# Patient Record
Sex: Male | Born: 1955
Health system: Southern US, Community
[De-identification: ages and names within clinical notes are randomized; demographics above are authoritative.]

## PROBLEM LIST (undated history)

## (undated) DIAGNOSIS — G4730 Sleep apnea, unspecified: Secondary | ICD-10-CM

## (undated) DIAGNOSIS — J180 Bronchopneumonia, unspecified organism: Secondary | ICD-10-CM

## (undated) DIAGNOSIS — R42 Dizziness and giddiness: Secondary | ICD-10-CM

## (undated) DIAGNOSIS — L309 Dermatitis, unspecified: Secondary | ICD-10-CM

## (undated) DIAGNOSIS — R011 Cardiac murmur, unspecified: Secondary | ICD-10-CM

## (undated) DIAGNOSIS — E119 Type 2 diabetes mellitus without complications: Secondary | ICD-10-CM

## (undated) DIAGNOSIS — M545 Low back pain, unspecified: Secondary | ICD-10-CM

## (undated) DIAGNOSIS — E785 Hyperlipidemia, unspecified: Secondary | ICD-10-CM

## (undated) DIAGNOSIS — F32A Depression, unspecified: Secondary | ICD-10-CM

## (undated) DIAGNOSIS — F419 Anxiety disorder, unspecified: Secondary | ICD-10-CM

## (undated) DIAGNOSIS — G5603 Carpal tunnel syndrome, bilateral upper limbs: Secondary | ICD-10-CM

## (undated) DIAGNOSIS — K219 Gastro-esophageal reflux disease without esophagitis: Secondary | ICD-10-CM

## (undated) DIAGNOSIS — F329 Major depressive disorder, single episode, unspecified: Secondary | ICD-10-CM

## (undated) DIAGNOSIS — I10 Essential (primary) hypertension: Secondary | ICD-10-CM

## (undated) HISTORY — DX: Gastro-esophageal reflux disease without esophagitis: K21.9

## (undated) HISTORY — DX: Low back pain: M54.5

## (undated) HISTORY — PX: POLYPECTOMY: SHX149

## (undated) HISTORY — DX: Dizziness and giddiness: R42

## (undated) HISTORY — DX: Anxiety disorder, unspecified: F41.9

## (undated) HISTORY — DX: Hyperlipidemia, unspecified: E78.5

## (undated) HISTORY — DX: Type 2 diabetes mellitus without complications: E11.9

## (undated) HISTORY — DX: Essential (primary) hypertension: I10

## (undated) HISTORY — DX: Low back pain, unspecified: M54.50

## (undated) HISTORY — DX: Major depressive disorder, single episode, unspecified: F32.9

## (undated) HISTORY — DX: Depression, unspecified: F32.A

## (undated) HISTORY — DX: Cardiac murmur, unspecified: R01.1

## (undated) HISTORY — DX: Sleep apnea, unspecified: G47.30

---

## 2003-11-21 ENCOUNTER — Ambulatory Visit: Payer: Self-pay | Admitting: Licensed Clinical Social Worker

## 2003-11-28 ENCOUNTER — Ambulatory Visit: Payer: Self-pay | Admitting: Licensed Clinical Social Worker

## 2003-12-05 ENCOUNTER — Ambulatory Visit: Payer: Self-pay | Admitting: Licensed Clinical Social Worker

## 2003-12-05 ENCOUNTER — Ambulatory Visit: Payer: Self-pay | Admitting: Internal Medicine

## 2003-12-12 ENCOUNTER — Ambulatory Visit: Payer: Self-pay | Admitting: Licensed Clinical Social Worker

## 2003-12-26 ENCOUNTER — Ambulatory Visit: Payer: Self-pay | Admitting: Internal Medicine

## 2003-12-26 ENCOUNTER — Ambulatory Visit (HOSPITAL_COMMUNITY): Admission: RE | Admit: 2003-12-26 | Discharge: 2003-12-26 | Payer: Self-pay | Admitting: Internal Medicine

## 2003-12-26 ENCOUNTER — Ambulatory Visit: Payer: Self-pay | Admitting: Licensed Clinical Social Worker

## 2004-01-05 ENCOUNTER — Ambulatory Visit: Payer: Self-pay | Admitting: Licensed Clinical Social Worker

## 2004-01-14 ENCOUNTER — Ambulatory Visit: Payer: Self-pay | Admitting: Licensed Clinical Social Worker

## 2004-01-23 ENCOUNTER — Ambulatory Visit: Payer: Self-pay | Admitting: Licensed Clinical Social Worker

## 2004-02-13 ENCOUNTER — Ambulatory Visit: Payer: Self-pay | Admitting: Licensed Clinical Social Worker

## 2004-03-12 ENCOUNTER — Ambulatory Visit: Payer: Self-pay | Admitting: Family Medicine

## 2004-03-12 ENCOUNTER — Ambulatory Visit: Payer: Self-pay | Admitting: Licensed Clinical Social Worker

## 2004-03-22 ENCOUNTER — Ambulatory Visit: Payer: Self-pay | Admitting: Family Medicine

## 2004-05-03 ENCOUNTER — Ambulatory Visit (HOSPITAL_COMMUNITY): Admission: RE | Admit: 2004-05-03 | Discharge: 2004-05-03 | Payer: Self-pay | Admitting: Neurosurgery

## 2004-05-28 ENCOUNTER — Ambulatory Visit: Payer: Self-pay | Admitting: Family Medicine

## 2005-08-08 ENCOUNTER — Ambulatory Visit: Payer: Self-pay | Admitting: Family Medicine

## 2005-08-15 ENCOUNTER — Ambulatory Visit: Payer: Self-pay | Admitting: Family Medicine

## 2005-11-11 ENCOUNTER — Ambulatory Visit: Payer: Self-pay | Admitting: Family Medicine

## 2006-01-02 ENCOUNTER — Encounter: Admission: RE | Admit: 2006-01-02 | Discharge: 2006-01-02 | Payer: Self-pay | Admitting: Family Medicine

## 2006-10-20 ENCOUNTER — Ambulatory Visit: Payer: Self-pay | Admitting: Family Medicine

## 2006-10-20 DIAGNOSIS — K219 Gastro-esophageal reflux disease without esophagitis: Secondary | ICD-10-CM | POA: Insufficient documentation

## 2006-10-20 DIAGNOSIS — R519 Headache, unspecified: Secondary | ICD-10-CM | POA: Insufficient documentation

## 2006-10-20 DIAGNOSIS — R51 Headache: Secondary | ICD-10-CM | POA: Insufficient documentation

## 2006-10-20 DIAGNOSIS — F321 Major depressive disorder, single episode, moderate: Secondary | ICD-10-CM | POA: Insufficient documentation

## 2006-10-23 LAB — CONVERTED CEMR LAB
ALT: 56 units/L — ABNORMAL HIGH (ref 0–53)
AST: 41 units/L — ABNORMAL HIGH (ref 0–37)
Albumin: 4 g/dL (ref 3.5–5.2)
Alkaline Phosphatase: 58 units/L (ref 39–117)
BUN: 10 mg/dL (ref 6–23)
Basophils Absolute: 0 10*3/uL (ref 0.0–0.1)
Basophils Relative: 0.3 % (ref 0.0–1.0)
Bilirubin, Direct: 0.1 mg/dL (ref 0.0–0.3)
CO2: 29 meq/L (ref 19–32)
Calcium: 9.6 mg/dL (ref 8.4–10.5)
Chloride: 106 meq/L (ref 96–112)
Cholesterol: 236 mg/dL (ref 0–200)
Creatinine, Ser: 1.1 mg/dL (ref 0.4–1.5)
Direct LDL: 181.4 mg/dL
Eosinophils Absolute: 0.4 10*3/uL (ref 0.0–0.6)
Eosinophils Relative: 5.1 % — ABNORMAL HIGH (ref 0.0–5.0)
GFR calc Af Amer: 90 mL/min
GFR calc non Af Amer: 75 mL/min
Glucose, Bld: 106 mg/dL — ABNORMAL HIGH (ref 70–99)
HCT: 44.9 % (ref 39.0–52.0)
HDL: 44.8 mg/dL (ref >39.0)
Hemoglobin: 15.4 g/dL (ref 13.0–17.0)
Lymphocytes Relative: 43.7 % (ref 12.0–46.0)
MCHC: 34.2 g/dL (ref 30.0–36.0)
MCV: 91.1 fL (ref 78.0–100.0)
Monocytes Absolute: 0.7 10*3/uL (ref 0.2–0.7)
Monocytes Relative: 10.1 % (ref 3.0–11.0)
Neutro Abs: 2.9 10*3/uL (ref 1.4–7.7)
Neutrophils Relative %: 40.8 % — ABNORMAL LOW (ref 43.0–77.0)
PSA: 0.95 ng/mL (ref 0.10–4.00)
Platelets: 313 10*3/uL (ref 150–400)
Potassium: 4.3 meq/L (ref 3.5–5.1)
RBC: 4.93 M/uL (ref 4.22–5.81)
RDW: 12.7 % (ref 11.5–14.6)
Sodium: 141 meq/L (ref 135–145)
TSH: 1.14 microintl units/mL (ref 0.35–5.50)
Total Bilirubin: 0.8 mg/dL (ref 0.3–1.2)
Total CHOL/HDL Ratio: 5.3
Total Protein: 6.8 g/dL (ref 6.0–8.3)
Triglycerides: 128 mg/dL (ref 0–149)
VLDL: 26 mg/dL (ref 0–40)
WBC: 7.1 10*3/uL (ref 4.5–10.5)

## 2006-10-27 ENCOUNTER — Ambulatory Visit: Payer: Self-pay | Admitting: Family Medicine

## 2006-10-27 DIAGNOSIS — E7800 Pure hypercholesterolemia, unspecified: Secondary | ICD-10-CM | POA: Insufficient documentation

## 2006-10-27 DIAGNOSIS — M545 Low back pain, unspecified: Secondary | ICD-10-CM | POA: Insufficient documentation

## 2006-10-27 DIAGNOSIS — E785 Hyperlipidemia, unspecified: Secondary | ICD-10-CM | POA: Insufficient documentation

## 2007-11-19 ENCOUNTER — Ambulatory Visit: Payer: Self-pay | Admitting: Family Medicine

## 2007-11-19 LAB — CONVERTED CEMR LAB
Bilirubin Urine: NEGATIVE
Glucose, Urine, Semiquant: NEGATIVE
Ketones, urine, test strip: NEGATIVE
Nitrite: NEGATIVE
Protein, U semiquant: NEGATIVE
Specific Gravity, Urine: 1.02
Urobilinogen, UA: 0.2
WBC Urine, dipstick: NEGATIVE
pH: 5.5

## 2007-11-20 LAB — CONVERTED CEMR LAB
ALT: 45 units/L (ref 0–53)
AST: 26 units/L (ref 0–37)
Albumin: 3.8 g/dL (ref 3.5–5.2)
Alkaline Phosphatase: 55 units/L (ref 39–117)
BUN: 17 mg/dL (ref 6–23)
Basophils Absolute: 0 10*3/uL (ref 0.0–0.1)
Basophils Relative: 0 % (ref 0.0–3.0)
Bilirubin, Direct: 0.1 mg/dL (ref 0.0–0.3)
CO2: 29 meq/L (ref 19–32)
Calcium: 9.4 mg/dL (ref 8.4–10.5)
Chloride: 104 meq/L (ref 96–112)
Cholesterol: 212 mg/dL (ref 0–200)
Creatinine, Ser: 1.2 mg/dL (ref 0.4–1.5)
Direct LDL: 129.7 mg/dL
Eosinophils Absolute: 0.3 10*3/uL (ref 0.0–0.7)
Eosinophils Relative: 4.4 % (ref 0.0–5.0)
GFR calc Af Amer: 81 mL/min
GFR calc non Af Amer: 67 mL/min
Glucose, Bld: 93 mg/dL (ref 70–99)
HCT: 43.5 % (ref 39.0–52.0)
HDL: 57.8 mg/dL (ref >39.0)
Hemoglobin: 15.2 g/dL (ref 13.0–17.0)
Lymphocytes Relative: 34.1 % (ref 12.0–46.0)
MCHC: 35 g/dL (ref 30.0–36.0)
MCV: 92.1 fL (ref 78.0–100.0)
Monocytes Absolute: 0.5 10*3/uL (ref 0.1–1.0)
Monocytes Relative: 7.7 % (ref 3.0–12.0)
Neutro Abs: 3.7 10*3/uL (ref 1.4–7.7)
Neutrophils Relative %: 53.8 % (ref 43.0–77.0)
PSA: 0.91 ng/mL (ref 0.10–4.00)
Platelets: 245 10*3/uL (ref 150–400)
Potassium: 4.2 meq/L (ref 3.5–5.1)
RBC: 4.72 M/uL (ref 4.22–5.81)
RDW: 12.6 % (ref 11.5–14.6)
Sodium: 141 meq/L (ref 135–145)
TSH: 1.17 microintl units/mL (ref 0.35–5.50)
Total Bilirubin: 0.7 mg/dL (ref 0.3–1.2)
Total CHOL/HDL Ratio: 3.7
Total Protein: 7 g/dL (ref 6.0–8.3)
Triglycerides: 55 mg/dL (ref 0–149)
VLDL: 11 mg/dL (ref 0–40)
WBC: 6.9 10*3/uL (ref 4.5–10.5)

## 2007-11-26 ENCOUNTER — Ambulatory Visit: Payer: Self-pay | Admitting: Family Medicine

## 2007-11-26 DIAGNOSIS — K5909 Other constipation: Secondary | ICD-10-CM | POA: Insufficient documentation

## 2007-11-26 DIAGNOSIS — R252 Cramp and spasm: Secondary | ICD-10-CM | POA: Insufficient documentation

## 2007-11-26 DIAGNOSIS — L989 Disorder of the skin and subcutaneous tissue, unspecified: Secondary | ICD-10-CM | POA: Insufficient documentation

## 2007-12-24 ENCOUNTER — Encounter: Payer: Self-pay | Admitting: Family Medicine

## 2008-12-15 ENCOUNTER — Ambulatory Visit: Payer: Self-pay | Admitting: Family Medicine

## 2008-12-17 LAB — CONVERTED CEMR LAB
ALT: 62 units/L — ABNORMAL HIGH (ref 0–53)
AST: 37 units/L (ref 0–37)
Albumin: 4 g/dL (ref 3.5–5.2)
Alkaline Phosphatase: 54 units/L (ref 39–117)
BUN: 12 mg/dL (ref 6–23)
Basophils Absolute: 0.1 10*3/uL (ref 0.0–0.1)
Basophils Relative: 0.7 % (ref 0.0–3.0)
Bilirubin Urine: NEGATIVE
Bilirubin, Direct: 0 mg/dL (ref 0.0–0.3)
CO2: 28 meq/L (ref 19–32)
Calcium: 9.1 mg/dL (ref 8.4–10.5)
Chloride: 103 meq/L (ref 96–112)
Cholesterol: 248 mg/dL — ABNORMAL HIGH (ref 0–200)
Creatinine, Ser: 1.1 mg/dL (ref 0.4–1.5)
Direct LDL: 177.8 mg/dL
Eosinophils Absolute: 0.4 10*3/uL (ref 0.0–0.7)
Eosinophils Relative: 4.6 % (ref 0.0–5.0)
GFR calc non Af Amer: 89.41 mL/min (ref >60)
Glucose, Bld: 96 mg/dL (ref 70–99)
Glucose, Urine, Semiquant: NEGATIVE
HCT: 46.1 % (ref 39.0–52.0)
HDL: 64.3 mg/dL (ref >39.00)
Hemoglobin: 15.6 g/dL (ref 13.0–17.0)
Ketones, urine, test strip: NEGATIVE
Lymphocytes Relative: 29.2 % (ref 12.0–46.0)
Lymphs Abs: 2.7 10*3/uL (ref 0.7–4.0)
MCHC: 33.8 g/dL (ref 30.0–36.0)
MCV: 94 fL (ref 78.0–100.0)
Monocytes Absolute: 0.9 10*3/uL (ref 0.1–1.0)
Monocytes Relative: 10.1 % (ref 3.0–12.0)
Neutro Abs: 5.1 10*3/uL (ref 1.4–7.7)
Neutrophils Relative %: 55.4 % (ref 43.0–77.0)
Nitrite: NEGATIVE
PSA: 1.16 ng/mL (ref 0.10–4.00)
Platelets: 230 10*3/uL (ref 150.0–400.0)
Potassium: 4.1 meq/L (ref 3.5–5.1)
Protein, U semiquant: NEGATIVE
RBC: 4.91 M/uL (ref 4.22–5.81)
RDW: 12.8 % (ref 11.5–14.6)
Sodium: 139 meq/L (ref 135–145)
Specific Gravity, Urine: 1.01
TSH: 2.06 microintl units/mL (ref 0.35–5.50)
Total Bilirubin: 0.7 mg/dL (ref 0.3–1.2)
Total CHOL/HDL Ratio: 4
Total Protein: 7.1 g/dL (ref 6.0–8.3)
Triglycerides: 128 mg/dL (ref 0.0–149.0)
Urobilinogen, UA: 0.2
VLDL: 25.6 mg/dL (ref 0.0–40.0)
WBC Urine, dipstick: NEGATIVE
WBC: 9.2 10*3/uL (ref 4.5–10.5)
pH: 6

## 2008-12-29 ENCOUNTER — Ambulatory Visit: Payer: Self-pay | Admitting: Family Medicine

## 2008-12-29 ENCOUNTER — Telehealth: Payer: Self-pay | Admitting: Family Medicine

## 2010-01-15 ENCOUNTER — Ambulatory Visit: Payer: Self-pay | Admitting: Family Medicine

## 2010-01-15 LAB — CONVERTED CEMR LAB
Bilirubin Urine: NEGATIVE
Glucose, Urine, Semiquant: NEGATIVE
Ketones, urine, test strip: NEGATIVE
Nitrite: NEGATIVE
Protein, U semiquant: NEGATIVE
Specific Gravity, Urine: 1.025
Urobilinogen, UA: 0.2
WBC Urine, dipstick: NEGATIVE
pH: 6.5

## 2010-01-22 LAB — CONVERTED CEMR LAB
ALT: 49 units/L (ref 0–53)
AST: 35 units/L (ref 0–37)
Albumin: 3.7 g/dL (ref 3.5–5.2)
Alkaline Phosphatase: 50 units/L (ref 39–117)
BUN: 16 mg/dL (ref 6–23)
Basophils Absolute: 0 10*3/uL (ref 0.0–0.1)
Basophils Relative: 0.5 % (ref 0.0–3.0)
Bilirubin, Direct: 0.1 mg/dL (ref 0.0–0.3)
CO2: 28 meq/L (ref 19–32)
Calcium: 9.1 mg/dL (ref 8.4–10.5)
Chloride: 105 meq/L (ref 96–112)
Cholesterol: 216 mg/dL — ABNORMAL HIGH (ref 0–200)
Creatinine, Ser: 1 mg/dL (ref 0.4–1.5)
Direct LDL: 156.8 mg/dL
Eosinophils Absolute: 0.4 10*3/uL (ref 0.0–0.7)
Eosinophils Relative: 4.7 % (ref 0.0–5.0)
GFR calc non Af Amer: 99.41 mL/min (ref >60.00)
Glucose, Bld: 85 mg/dL (ref 70–99)
HCT: 45.9 % (ref 39.0–52.0)
HDL: 58.1 mg/dL (ref >39.00)
Hemoglobin: 15.4 g/dL (ref 13.0–17.0)
Lymphocytes Relative: 39.1 % (ref 12.0–46.0)
Lymphs Abs: 3 10*3/uL (ref 0.7–4.0)
MCHC: 33.6 g/dL (ref 30.0–36.0)
MCV: 94 fL (ref 78.0–100.0)
Monocytes Absolute: 0.8 10*3/uL (ref 0.1–1.0)
Monocytes Relative: 10.1 % (ref 3.0–12.0)
Neutro Abs: 3.5 10*3/uL (ref 1.4–7.7)
Neutrophils Relative %: 45.6 % (ref 43.0–77.0)
PSA: 1.33 ng/mL (ref 0.10–4.00)
Platelets: 245 10*3/uL (ref 150.0–400.0)
Potassium: 4.2 meq/L (ref 3.5–5.1)
RBC: 4.88 M/uL (ref 4.22–5.81)
RDW: 14.1 % (ref 11.5–14.6)
Sodium: 140 meq/L (ref 135–145)
TSH: 1.45 microintl units/mL (ref 0.35–5.50)
Total Bilirubin: 0.6 mg/dL (ref 0.3–1.2)
Total CHOL/HDL Ratio: 4
Total Protein: 6.5 g/dL (ref 6.0–8.3)
Triglycerides: 126 mg/dL (ref 0.0–149.0)
VLDL: 25.2 mg/dL (ref 0.0–40.0)
WBC: 7.7 10*3/uL (ref 4.5–10.5)

## 2010-01-29 ENCOUNTER — Ambulatory Visit
Admission: RE | Admit: 2010-01-29 | Discharge: 2010-01-29 | Payer: Self-pay | Source: Home / Self Care | Attending: Family Medicine | Admitting: Family Medicine

## 2010-01-29 ENCOUNTER — Encounter: Payer: Self-pay | Admitting: Family Medicine

## 2010-01-29 DIAGNOSIS — R3129 Other microscopic hematuria: Secondary | ICD-10-CM | POA: Insufficient documentation

## 2010-02-18 NOTE — Assessment & Plan Note (Signed)
Summary: cpx/cjr   Vital Signs:  Patient profile:   55 year old male Height:      74.5 inches Weight:      259 pounds BMI:     32.93 O2 Sat:      95 % Temp:     98.2 degrees F Pulse rate:   97 / minute BP sitting:   124 / 86  (left arm) Cuff size:   large  Vitals Entered By: Pura Spice, RN (January 29, 2010 9:02 AM)  Contraindications/Deferment of Procedures/Staging:    Test/Procedure: FLU VAX    Reason for deferment: patient declined  CC: cpx had DOT PE 4 months ago and was told had rt lymph node swollen in rt shoulder area, blood in UA and wants blister left mouth area checked.   History of Present Illness: 55 yr old male for a cpx. he feels good but has some questions. He had a DOT exam about 4 months ago and was told there was some blood in his urine. He has never seen this himself, and he has no urinary symptoms. Also, for 3 months he has had an asymptomatic bump on the inside of his left cheek.   Allergies: 1)  ! Celexa  Past History:  Past Medical History: Reviewed history from 12/29/2008 and no changes required. Depression GERD Headache Hyperlipidemia Low back pain glaucoma, sees Dr. Nelle Don lichen simplex chronicus on the scrotum, per Dr. Terri Piedra  Past Surgical History: Denies surgical history colonoscopy 01-18-03  per Dr. Arlyce Dice, repeat in 10  yrs  Family History: Reviewed history from 10/20/2006 and no changes required. Family History Diabetes 1st degree relative  Social History: Reviewed history from 10/20/2006 and no changes required. Occupation: long Production assistant, radio Married Never Smoked Alcohol use-no Drug use-no Regular exercise-no  Review of Systems  The patient denies anorexia, fever, weight loss, weight gain, vision loss, decreased hearing, hoarseness, chest pain, syncope, dyspnea on exertion, peripheral edema, prolonged cough, headaches, hemoptysis, abdominal pain, melena, hematochezia, severe indigestion/heartburn, hematuria,  incontinence, genital sores, muscle weakness, suspicious skin lesions, transient blindness, difficulty walking, depression, unusual weight change, abnormal bleeding, enlarged lymph nodes, angioedema, breast masses, and testicular masses.    Physical Exam  General:  overweight-appearing.   Head:  Normocephalic and atraumatic without obvious abnormalities. No apparent alopecia or balding. Eyes:  No corneal or conjunctival inflammation noted. EOMI. Perrla. Funduscopic exam benign, without hemorrhages, exudates or papilledema. Vision grossly normal. Ears:  External ear exam shows no significant lesions or deformities.  Otoscopic examination reveals clear canals, tympanic membranes are intact bilaterally without bulging, retraction, inflammation or discharge. Hearing is grossly normal bilaterally. Nose:  External nasal examination shows no deformity or inflammation. Nasal mucosa are pink and moist without lesions or exudates. Mouth:  clear except for a small papular whitish lesion on the left inner cheek  Neck:  clear except for nontender large lipoma over the posterior neck. Full ROM  Chest Wall:  No deformities, masses, tenderness or gynecomastia noted. Lungs:  Normal respiratory effort, chest expands symmetrically. Lungs are clear to auscultation, no crackles or wheezes. Heart:  Normal rate and regular rhythm. S1 and S2 normal without gallop, murmur, click, rub or other extra sounds. EKG normal except for 1st degree AV block  Abdomen:  Bowel sounds positive,abdomen soft and non-tender without masses, organomegaly or hernias noted. Rectal:  No external abnormalities noted. Normal sphincter tone. No rectal masses or tenderness. Heme neg. Genitalia:  Testes bilaterally descended without nodularity, tenderness or masses. No  scrotal masses or lesions. No penis lesions or urethral discharge. Prostate:  Prostate gland firm and smooth, no enlargement, nodularity, tenderness, mass, asymmetry or  induration. Msk:  No deformity or scoliosis noted of thoracic or lumbar spine.   Pulses:  R and L carotid,radial,femoral,dorsalis pedis and posterior tibial pulses are full and equal bilaterally Extremities:  No clubbing, cyanosis, edema, or deformity noted with normal full range of motion of all joints.   Neurologic:  No cranial nerve deficits noted. Station and gait are normal. Plantar reflexes are down-going bilaterally. DTRs are symmetrical throughout. Sensory, motor and coordinative functions appear intact. Skin:  Intact without suspicious lesions or rashes Cervical Nodes:  No lymphadenopathy noted Axillary Nodes:  No palpable lymphadenopathy Inguinal Nodes:  No significant adenopathy Psych:  Cognition and judgment appear intact. Alert and cooperative with normal attention span and concentration. No apparent delusions, illusions, hallucinations   Impression & Recommendations:  Problem # 1:  EXAMINATION, ROUTINE MEDICAL (ICD-V70.0)  Orders: Hemoccult Guaiac-1 spec.(in office) (82270) EKG w/ Interpretation (93000)  Complete Medication List: 1)  Travatan 0.004 % Soln (Travoprost) .... One drop once daily left eye  Other Orders: Urology Referral (Urology)  Patient Instructions: 1)  It is important that you exercise reguarly at least 20 minutes 5 times a week. If you develop chest pain, have severe difficulty breathing, or feel very tired, stop exercising immediately and seek medical attention.  2)  You need to lose weight. Consider a lower calorie diet and regular exercise.  3)  The lesion in his mouth seems to be a benign mucoid cyst. he is due to see his dentist soon, and I told him the dentist could probably removed this in the office. If they were not able to do that, I can refer him to ENT. 4)  Will refer to Urology for the microscopic hematuria    Orders Added: 1)  Est. Patient 40-64 years [99396] 2)  Hemoccult Guaiac-1 spec.(in office) [82270] 3)  EKG w/ Interpretation  [93000] 4)  Urology Referral [Urology]

## 2010-06-04 NOTE — Assessment & Plan Note (Signed)
[facility] OFFICE NOTE   Curtis Duran, Curtis Duran                   MRN:          [id number]  DATE:08/15/2005                            DOB:          11-09-1969    REASON FOR VISIT:  This is a 55 year old gentleman here for complete  physical examination.  He has several things to discuss.  First off, over  the past few months he has had a lot of nocturnal cramps in his feet, and  when he gets out of bed and walks around, they ease off.  They do not bother  him too much during the day.  No other muscles or joints seem to bother him  much.  His lab work shows normal electrolytes, including potassium, as noted  below.  Also, he has been off of Celexa now for about a year.  He felt okay  for awhile, but over the past few months, his depression symptoms have crept  back.  He now feels tired a lot, sad, looses his temper easily, has poor  sleep, etc.   For further details of his past medical history, family history, social  history, habits, etc., I refer you to our introductory note with him dated  March 12, 2004.   ALLERGIES:  NONE.   CURRENT MEDICATIONS:  Aspirin 81 mg daily.   OBJECTIVE:  VITAL SIGNS:  Height 6 feet 4 inches, weight 250.  Blood  pressure 110/70, pulse 70 and regular.  GENERAL:  He appears to be healthy, although he is a little overweight.  Affect is bright.  SKIN:  Free of significant lesions.  HEENT:  Eyes clear.  Ears clear.  Oropharynx clear.  NECK:  Supple without lymphadenopathy or masses.  Carotids show no bruits.  LUNGS:  Clear.  CARDIAC:  Regular rate and rhythm without murmurs, rubs, or gallops.  Distal  pulses are full.  EKG is within normal limits.  ABDOMEN:  Soft, normal bowel sounds, nontender, no masses.  GU:  Normal male genitalia.  He is not circumcised.  RECTAL:  No masses or tenderness.  Prostate is within normal limits.  Stool  is Hemoccult-negative.  EXTREMITIES:   No clubbing, cyanosis, or edema.  NEUROLOGIC:  Grossly intact.   LABORATORY DATA:  He was here for fasting labs on August 08, 2005.  These were  all within normal limits, except for his lipid panel.  Triglycerides were up  at 224.  The LDL was up to 45.  The ______________ was up to 124.  HDL was  slightly low at 36.   ASSESSMENT AND PLAN:  1.  Complete physical exam.  We talked about getting more exercise and      loosing a little weight.  2.  Hyperlipidemia.  We talked about dietary changes he can make.  3.  Depression.  Will get back on Celexa 20 mg once daily.  4.  Foot cramps.  Gave him samples to try Mirapex, to titrate up to 0.25 mg      once at bedtime.  After 10 days, he will call me back to  let me know how      they are working.                                   Tera Mater. Clent Ridges, MD   SAF/MedQ  DD:  08/15/2005  DT:  08/16/2005  Job #:  [id number]

## 2011-03-04 ENCOUNTER — Other Ambulatory Visit: Payer: 59

## 2011-03-07 ENCOUNTER — Other Ambulatory Visit (INDEPENDENT_AMBULATORY_CARE_PROVIDER_SITE_OTHER): Payer: 59

## 2011-03-07 DIAGNOSIS — Z0000 Encounter for general adult medical examination without abnormal findings: Secondary | ICD-10-CM

## 2011-03-07 LAB — CBC WITH DIFFERENTIAL/PLATELET
Basophils Relative: 0.4 % (ref 0.0–3.0)
Eosinophils Absolute: 0.4 10*3/uL (ref 0.0–0.7)
Eosinophils Relative: 4.5 % (ref 0.0–5.0)
Hemoglobin: 16.3 g/dL (ref 13.0–17.0)
Lymphocytes Relative: 37.3 % (ref 12.0–46.0)
Lymphs Abs: 3.4 10*3/uL (ref 0.7–4.0)
MCHC: 33.6 g/dL (ref 30.0–36.0)
MCV: 92.3 fl (ref 78.0–100.0)
Monocytes Absolute: 0.8 10*3/uL (ref 0.1–1.0)
Monocytes Relative: 8.8 % (ref 3.0–12.0)
Neutro Abs: 4.5 10*3/uL (ref 1.4–7.7)
Neutrophils Relative %: 49 % (ref 43.0–77.0)
Platelets: 247 10*3/uL (ref 150.0–400.0)
RBC: 5.24 Mil/uL (ref 4.22–5.81)
RDW: 13.7 % (ref 11.5–14.6)
WBC: 9.1 10*3/uL (ref 4.5–10.5)

## 2011-03-07 LAB — POCT URINALYSIS DIPSTICK
Bilirubin, UA: NEGATIVE
Glucose, UA: NEGATIVE
Leukocytes, UA: NEGATIVE
Nitrite, UA: NEGATIVE
Spec Grav, UA: <=1.005
Urobilinogen, UA: 0.2
pH, UA: 6

## 2011-03-07 LAB — LIPID PANEL
Cholesterol: 249 mg/dL — ABNORMAL HIGH (ref 0–200)
HDL: 65.2 mg/dL (ref >39.00)
Total CHOL/HDL Ratio: 4
Triglycerides: 86 mg/dL (ref 0.0–149.0)

## 2011-03-07 LAB — HEPATIC FUNCTION PANEL
ALT: 58 U/L — ABNORMAL HIGH (ref 0–53)
AST: 30 U/L (ref 0–37)
Albumin: 4.4 g/dL (ref 3.5–5.2)
Alkaline Phosphatase: 54 U/L (ref 39–117)
Bilirubin, Direct: 0.1 mg/dL (ref 0.0–0.3)
Total Bilirubin: 0.4 mg/dL (ref 0.3–1.2)
Total Protein: 7.3 g/dL (ref 6.0–8.3)

## 2011-03-07 LAB — BASIC METABOLIC PANEL
CO2: 27 mEq/L (ref 19–32)
Calcium: 9.5 mg/dL (ref 8.4–10.5)
Creatinine, Ser: 1.1 mg/dL (ref 0.4–1.5)
GFR: 89.64 mL/min (ref >60.00)
Glucose, Bld: 81 mg/dL (ref 70–99)
Sodium: 138 mEq/L (ref 135–145)

## 2011-03-07 LAB — TSH: TSH: 1.7 u[IU]/mL (ref 0.35–5.50)

## 2011-03-07 LAB — PSA: PSA: 2.02 ng/mL (ref 0.10–4.00)

## 2011-03-07 LAB — LDL CHOLESTEROL, DIRECT: Direct LDL: 169.6 mg/dL

## 2011-03-11 ENCOUNTER — Encounter: Payer: Self-pay | Admitting: Family Medicine

## 2011-03-11 NOTE — Progress Notes (Signed)
Quick Note:  I tried to reach pt by phone, no answer. I put a copy of results in mail. I will send in script on 03/14/11, when pt comes in for his CPE. ______

## 2011-03-14 ENCOUNTER — Encounter: Payer: Self-pay | Admitting: Family Medicine

## 2011-03-14 ENCOUNTER — Ambulatory Visit (INDEPENDENT_AMBULATORY_CARE_PROVIDER_SITE_OTHER): Payer: 59 | Admitting: Family Medicine

## 2011-03-14 VITALS — BP 122/70 | HR 117 | Temp 98.9°F | Ht 76.0 in | Wt 259.0 lb

## 2011-03-14 DIAGNOSIS — Z0000 Encounter for general adult medical examination without abnormal findings: Secondary | ICD-10-CM

## 2011-03-14 MED ORDER — ATORVASTATIN CALCIUM 20 MG PO TABS: 20.0000 mg | ORAL_TABLET | Freq: Every day | ORAL | Status: DC

## 2011-03-14 MED ORDER — BUPROPION HCL ER (XL) 150 MG PO TB24: 150.0000 mg | ORAL_TABLET | Freq: Every day | ORAL | Status: DC

## 2011-03-14 NOTE — Progress Notes (Signed)
  Subjective:    Patient ID: Curtis Duran, male    DOB: 01-25-1955, 56 y.o.   MRN: [id number]  HPI 56 yr old male for a cpx. He feels well in general except he has had some depression flare up lately. He feels sad at times and tends to isolate himself from friends. He gets a short temper at times. We tried Citalopram a few years ago but he had to stop it due to a rash. He also saw Judithe Modest for awhile for therapy, but he found this to not be helpful. He sleeps well.    Review of Systems  Constitutional: Negative.   HENT: Negative.   Eyes: Negative.   Respiratory: Negative.   Cardiovascular: Negative.   Gastrointestinal: Negative.   Genitourinary: Negative.   Musculoskeletal: Negative.   Skin: Negative.   Neurological: Negative.   Hematological: Negative.   Psychiatric/Behavioral: Positive for dysphoric mood. Negative for suicidal ideas, hallucinations, behavioral problems, confusion, sleep disturbance, decreased concentration and agitation. The patient is nervous/anxious.        Objective:   Physical Exam  Constitutional: He is oriented to person, place, and time. He appears well-developed and well-nourished. No distress.  HENT:  Head: Normocephalic and atraumatic.  Right Ear: External ear normal.  Left Ear: External ear normal.  Nose: Nose normal.  Mouth/Throat: Oropharynx is clear and moist. No oropharyngeal exudate.  Eyes: Conjunctivae and EOM are normal. Pupils are equal, round, and reactive to light. Right eye exhibits no discharge. Left eye exhibits no discharge. No scleral icterus.  Neck: Neck supple. No JVD present. No tracheal deviation present. No thyromegaly present.  Cardiovascular: Normal rate, regular rhythm, normal heart sounds and intact distal pulses.  Exam reveals no gallop and no friction rub.   No murmur heard.      EKG normal   Pulmonary/Chest: Effort normal and breath sounds normal. No respiratory distress. He has no wheezes. He has no rales. He  exhibits no tenderness.  Abdominal: Soft. Bowel sounds are normal. He exhibits no distension and no mass. There is no tenderness. There is no rebound and no guarding.  Genitourinary: Rectum normal, prostate normal and penis normal. Guaiac negative stool. No penile tenderness.  Musculoskeletal: Normal range of motion. He exhibits no edema and no tenderness.  Lymphadenopathy:    He has no cervical adenopathy.  Neurological: He is alert and oriented to person, place, and time. He has normal reflexes. No cranial nerve deficit. He exhibits normal muscle tone. Coordination normal.  Skin: Skin is warm and dry. No rash noted. He is not diaphoretic. No erythema. No pallor.  Psychiatric: He has a normal mood and affect. His behavior is normal. Judgment and thought content normal.          Assessment & Plan:  Well exam. Try Wellbutrin for depression, and recheck in 3-4 weeks

## 2011-06-03 ENCOUNTER — Telehealth: Payer: Self-pay | Admitting: Family Medicine

## 2011-06-03 MED ORDER — ATORVASTATIN CALCIUM 20 MG PO TABS: 20.0000 mg | ORAL_TABLET | Freq: Every day | ORAL | Status: DC

## 2011-06-03 NOTE — Telephone Encounter (Signed)
Refill request for Bupropion HCL XL 150 mg take 1 po qd and pt last here on 03/14/11.

## 2011-06-03 NOTE — Telephone Encounter (Signed)
Refill for one year 

## 2011-06-06 MED ORDER — BUPROPION HCL ER (XL) 150 MG PO TB24: 150.0000 mg | ORAL_TABLET | Freq: Every day | ORAL | Status: DC

## 2011-06-06 NOTE — Telephone Encounter (Signed)
I'm waiting on pt to return my call, need to know where to send in script and if it is a 90 day supply?

## 2011-06-07 NOTE — Telephone Encounter (Signed)
I tried to reach pt again, no answer.  

## 2011-06-10 ENCOUNTER — Other Ambulatory Visit: Payer: Self-pay | Admitting: Family Medicine

## 2011-06-10 NOTE — Telephone Encounter (Signed)
Pt is now requesting to get Butropion HCL XL 150 mg through mail order. ( [facility] ) Can I send this in?

## 2011-06-14 ENCOUNTER — Telehealth: Payer: Self-pay | Admitting: Family Medicine

## 2011-06-14 DIAGNOSIS — E785 Hyperlipidemia, unspecified: Secondary | ICD-10-CM

## 2011-06-14 MED ORDER — BUPROPION HCL ER (XL) 150 MG PO TB24: 150.0000 mg | ORAL_TABLET | Freq: Every day | ORAL | Status: DC

## 2011-06-14 NOTE — Telephone Encounter (Signed)
Lipids and liver panel for 272.4 , thanks

## 2011-06-14 NOTE — Telephone Encounter (Signed)
Pt is on chole med and was told to repeat labs in 90days. What labs?

## 2011-06-14 NOTE — Telephone Encounter (Signed)
Can you call to schedule the lab appointment? I put in future lab order.

## 2011-06-15 ENCOUNTER — Other Ambulatory Visit: Payer: Self-pay | Admitting: Family Medicine

## 2011-06-15 NOTE — Telephone Encounter (Signed)
Called pt and sch for labs as noted.  °

## 2011-06-23 ENCOUNTER — Telehealth: Payer: Self-pay | Admitting: Family Medicine

## 2011-06-23 MED ORDER — BUPROPION HCL ER (XL) 150 MG PO TB24: 150.0000 mg | ORAL_TABLET | Freq: Every day | ORAL | Status: DC

## 2011-06-23 NOTE — Telephone Encounter (Signed)
Pt requested a 90 day supply of Wellbutrin and send to [facility]. I did print and faxed.

## 2011-06-27 ENCOUNTER — Other Ambulatory Visit (INDEPENDENT_AMBULATORY_CARE_PROVIDER_SITE_OTHER): Payer: 59

## 2011-06-27 DIAGNOSIS — E785 Hyperlipidemia, unspecified: Secondary | ICD-10-CM

## 2011-06-27 LAB — LIPID PANEL
Cholesterol: 145 mg/dL (ref 0–200)
LDL Cholesterol: 65 mg/dL (ref 0–99)
Total CHOL/HDL Ratio: 2
Triglycerides: 72 mg/dL (ref 0.0–149.0)

## 2011-06-27 LAB — HEPATIC FUNCTION PANEL
ALT: 52 U/L (ref 0–53)
AST: 27 U/L (ref 0–37)
Alkaline Phosphatase: 47 U/L (ref 39–117)
Bilirubin, Direct: 0.1 mg/dL (ref 0.0–0.3)
Total Bilirubin: 0.3 mg/dL (ref 0.3–1.2)
Total Protein: 6.9 g/dL (ref 6.0–8.3)

## 2012-01-18 HISTORY — PX: CERVICAL SPINE SURGERY: SHX589

## 2012-02-10 ENCOUNTER — Ambulatory Visit (INDEPENDENT_AMBULATORY_CARE_PROVIDER_SITE_OTHER): Payer: 59 | Admitting: Family Medicine

## 2012-02-10 ENCOUNTER — Encounter: Payer: Self-pay | Admitting: Family Medicine

## 2012-02-10 VITALS — BP 130/76 | HR 107 | Temp 98.4°F | Wt 265.0 lb

## 2012-02-10 DIAGNOSIS — M542 Cervicalgia: Secondary | ICD-10-CM

## 2012-02-10 MED ORDER — DICLOFENAC SODIUM 75 MG PO TBEC: 75.0000 mg | DELAYED_RELEASE_TABLET | Freq: Two times a day (BID) | ORAL | Status: DC

## 2012-02-10 NOTE — Progress Notes (Signed)
  Subjective:    Patient ID: Curtis Duran, male    DOB: February 25, 1955, 57 y.o.   MRN: [id number]  HPI Here for 3 weeks of sharp sometimes severe pains in the left neck, the left shoulder, and the left arm. These are worse when he rests the arm down at his side or when he lies flat in bed. It feels better when he raises the arm over his head. The left lower arm and hand often go numb. The arm is weak and he has trouble gripping things with the left hand. No recent trauma. Aleve and heat help slightly.    Review of Systems  Musculoskeletal: Positive for myalgias and arthralgias.  Neurological: Positive for weakness and numbness.       Objective:   Physical Exam  Constitutional: He appears well-developed and well-nourished.  Musculoskeletal:       He is quite tender over the cervical spine and the left posterior neck. ROM is reduced a bit. He has a larger lipoma over the posterior neck. Tender over the left trapezius and the left anterior shoulder. ROM of the shoulder is full.           Assessment & Plan:  He seems to have a pinched nerve in the neck which is sending symptoms radiating down the left arm. Try Diclofenac. Set up an MRI soon. Out of work today until 02-19-12.

## 2012-02-15 ENCOUNTER — Telehealth: Payer: Self-pay | Admitting: Family Medicine

## 2012-02-15 NOTE — Telephone Encounter (Signed)
Pt needed work note extended, per Dr. Clent Ridges okay to give. ( 02/19/12-02/21/12 ) returning on 02/22/12. Note is ready for pick up and I spoke with pt.

## 2012-02-17 ENCOUNTER — Ambulatory Visit
Admission: RE | Admit: 2012-02-17 | Discharge: 2012-02-17 | Disposition: A | Discharge status: Home / Self Care | Payer: 59 | Source: Ambulatory Visit | Attending: Family Medicine | Admitting: Family Medicine

## 2012-02-17 DIAGNOSIS — M542 Cervicalgia: Secondary | ICD-10-CM

## 2012-02-21 ENCOUNTER — Telehealth: Payer: Self-pay | Admitting: Family Medicine

## 2012-02-21 NOTE — Telephone Encounter (Signed)
I did speak with pt and went over results. He wants to know when he can return to work? He is still having pain and not sure that he can work?

## 2012-02-21 NOTE — Progress Notes (Signed)
Quick Note:  I spoke with pt ______ 

## 2012-02-21 NOTE — Addendum Note (Signed)
Addended by: Gershon Crane A on: 02/21/2012 06:07 AM   Modules accepted: Orders

## 2012-02-21 NOTE — Telephone Encounter (Signed)
Pt would like MRI results from friday

## 2012-02-22 NOTE — Telephone Encounter (Signed)
I left voice message with below information. 

## 2012-02-22 NOTE — Telephone Encounter (Signed)
I would stay out of work until he is evaluated by Neurosurgery

## 2012-03-06 ENCOUNTER — Other Ambulatory Visit (HOSPITAL_COMMUNITY): Payer: Self-pay | Admitting: Neurosurgery

## 2012-03-06 ENCOUNTER — Other Ambulatory Visit: Payer: Self-pay | Admitting: Neurosurgery

## 2012-03-06 DIAGNOSIS — M47812 Spondylosis without myelopathy or radiculopathy, cervical region: Secondary | ICD-10-CM

## 2012-03-09 ENCOUNTER — Telehealth: Payer: Self-pay | Admitting: Family Medicine

## 2012-03-09 NOTE — Telephone Encounter (Signed)
I already filled these papers out days ago and sent to have them faxed out, check with Medical Records

## 2012-03-09 NOTE — Telephone Encounter (Signed)
FYI: Pt states he is trying to get short term disibiilty. Company has sent several requests, but they have not heard from [place]. [employer] is faxing papers today for Dr Clent Ridges to fill out, and they must be filled out today in order for him to get this short term disibility. Pt's grace period has expired, but if they get today, they will file for him. If not, they will close file, and he will never get.

## 2012-03-09 NOTE — Telephone Encounter (Signed)
I spoke with pt and faxed over a copy of the office notes to [employer] [phone] and the rest of the of information should come from Dr. Barbaraann Barthel ( neurosurgery )

## 2012-03-16 ENCOUNTER — Other Ambulatory Visit: Payer: 59

## 2012-03-22 ENCOUNTER — Encounter (HOSPITAL_COMMUNITY): Payer: Self-pay

## 2012-03-22 ENCOUNTER — Ambulatory Visit (HOSPITAL_COMMUNITY)
Admission: RE | Admit: 2012-03-22 | Discharge: 2012-03-22 | Disposition: A | Discharge status: Home / Self Care | Payer: 59 | Source: Ambulatory Visit | Attending: Neurosurgery | Admitting: Neurosurgery

## 2012-03-22 DIAGNOSIS — M25519 Pain in unspecified shoulder: Secondary | ICD-10-CM | POA: Insufficient documentation

## 2012-03-22 DIAGNOSIS — R209 Unspecified disturbances of skin sensation: Secondary | ICD-10-CM | POA: Insufficient documentation

## 2012-03-22 DIAGNOSIS — M47812 Spondylosis without myelopathy or radiculopathy, cervical region: Secondary | ICD-10-CM

## 2012-03-22 DIAGNOSIS — M4802 Spinal stenosis, cervical region: Secondary | ICD-10-CM | POA: Insufficient documentation

## 2012-03-22 DIAGNOSIS — M79609 Pain in unspecified limb: Secondary | ICD-10-CM | POA: Insufficient documentation

## 2012-03-22 DIAGNOSIS — M5020 Other cervical disc displacement, unspecified cervical region: Secondary | ICD-10-CM | POA: Insufficient documentation

## 2012-03-22 MED ORDER — IOHEXOL 300 MG/ML  SOLN
10.0000 mL | Freq: Once | INTRAMUSCULAR | Status: AC | PRN
  Administered 2012-03-22: 10 mL via INTRATHECAL

## 2012-03-22 MED ORDER — OXYCODONE HCL 5 MG PO TABS
5.0000 mg | ORAL_TABLET | ORAL | Status: DC | PRN
  Administered 2012-03-22: 5 mg via ORAL

## 2012-03-22 MED ORDER — DIAZEPAM 5 MG PO TABS
ORAL_TABLET | ORAL | Status: AC
  Administered 2012-03-22: 10 mg via ORAL
  Filled 2012-03-22: qty 2

## 2012-03-22 MED ORDER — OXYCODONE HCL 5 MG PO TABS
ORAL_TABLET | ORAL | Status: AC
  Filled 2012-03-22: qty 1

## 2012-03-22 MED ORDER — DIAZEPAM 5 MG PO TABS
10.0000 mg | ORAL_TABLET | Freq: Once | ORAL | Status: AC
  Administered 2012-03-22: 10 mg via ORAL

## 2012-03-22 MED ORDER — ONDANSETRON HCL 4 MG/2ML IJ SOLN: 4.0000 mg | Freq: Four times a day (QID) | INTRAMUSCULAR | Status: DC | PRN

## 2012-03-23 ENCOUNTER — Encounter: Payer: 59 | Admitting: Family Medicine

## 2012-05-03 ENCOUNTER — Other Ambulatory Visit: Payer: Self-pay | Admitting: Family Medicine

## 2012-06-19 ENCOUNTER — Other Ambulatory Visit: Payer: Self-pay | Admitting: Family Medicine

## 2012-08-06 ENCOUNTER — Encounter: Payer: Self-pay | Admitting: Gastroenterology

## 2012-10-01 ENCOUNTER — Encounter: Payer: Self-pay | Admitting: Gastroenterology

## 2012-10-22 ENCOUNTER — Ambulatory Visit (AMBULATORY_SURGERY_CENTER): Payer: Self-pay | Admitting: *Deleted

## 2012-10-22 VITALS — Ht 76.0 in | Wt 263.2 lb

## 2012-10-22 DIAGNOSIS — Z1211 Encounter for screening for malignant neoplasm of colon: Secondary | ICD-10-CM

## 2012-10-22 MED ORDER — NA SULFATE-K SULFATE-MG SULF 17.5-3.13-1.6 GM/177ML PO SOLN: 1.0000 | Freq: Once | ORAL | Status: DC

## 2012-10-22 NOTE — Progress Notes (Signed)
No allergies to eggs or soy. No problems with sedation.

## 2012-10-23 ENCOUNTER — Encounter: Payer: Self-pay | Admitting: Gastroenterology

## 2012-11-16 ENCOUNTER — Encounter: Payer: 59 | Admitting: Gastroenterology

## 2012-12-19 ENCOUNTER — Ambulatory Visit (AMBULATORY_SURGERY_CENTER): Payer: 59 | Admitting: Gastroenterology

## 2012-12-19 ENCOUNTER — Encounter: Payer: Self-pay | Admitting: Gastroenterology

## 2012-12-19 VITALS — BP 110/73 | HR 90 | Temp 97.1°F | Resp 21 | Ht 76.0 in | Wt 263.0 lb

## 2012-12-19 DIAGNOSIS — Z1211 Encounter for screening for malignant neoplasm of colon: Secondary | ICD-10-CM

## 2012-12-19 DIAGNOSIS — D126 Benign neoplasm of colon, unspecified: Secondary | ICD-10-CM

## 2012-12-19 DIAGNOSIS — K5730 Diverticulosis of large intestine without perforation or abscess without bleeding: Secondary | ICD-10-CM

## 2012-12-19 HISTORY — PX: COLONOSCOPY: SHX174

## 2012-12-19 MED ORDER — SODIUM CHLORIDE 0.9 % IV SOLN: 500.0000 mL | INTRAVENOUS | Status: DC

## 2012-12-19 NOTE — Op Note (Signed)
[facility] [address] [place], [address]   COLONOSCOPY PROCEDURE REPORT  PATIENT: Curtis Duran, Curtis Duran  MR#: [id number] BIRTHDATE: 01-Nov-1955 , 57  yrs. old GENDER: Male ENDOSCOPIST: Louis Meckel, MD REFERRED QI:ONGEX Pratt, M.D. PROCEDURE DATE:  12/19/2012 PROCEDURE:   Colonoscopy with snare polypectomy First Screening Colonoscopy - Avg.  risk and is 50 yrs.  old or older - No.  Prior Negative Screening - Now for repeat screening. 10 or more years since last screening  History of Adenoma - Now for follow-up colonoscopy & has been > or = to 3 yrs.  N/A  Polyps Removed Today? Yes. ASA CLASS:   Class II INDICATIONS:Average risk patient for colon cancer. MEDICATIONS: MAC sedation, administered by CRNA and propofol (Diprivan) 300mg  IV  DESCRIPTION OF PROCEDURE:   After the risks benefits and alternatives of the procedure were thoroughly explained, informed consent was obtained.  A digital rectal exam revealed no abnormalities of the rectum.   The LB [id number]  endoscope was introduced through the anus and advanced to the cecum, which was identified by both the appendix and ileocecal valve.  I was unable to retroflex in the cecum to visualize the descending colon. No adverse events experienced.   The quality of the prep was Suprep good  The instrument was then slowly withdrawn as the colon was fully examined.      COLON FINDINGS: A sessile polyp measuring 4 mm in size was found in the ascending colon.  A polypectomy was performed with a cold snare.  The resection was complete and the polyp tissue was completely retrieved.   Mild diverticulosis was noted in the ascending colon.   Moderate diverticulosis was noted in the sigmoid colon and descending colon.   The colon mucosa was otherwise normal.  Retroflexed views revealed no abnormalities. The time to cecum=4 minutes 38 seconds.  Withdrawal time=12 minutes 36 seconds. The scope was withdrawn and  the procedure completed. COMPLICATIONS: There were no complications.  ENDOSCOPIC IMPRESSION: 1.   Sessile polyp measuring 4 mm in size was found in the ascending colon; polypectomy was performed with a cold snare 2.   Mild diverticulosis was noted in the ascending colon 3.   Moderate diverticulosis was noted in the sigmoid colon and descending colon 4.   The colon mucosa was otherwise normal  RECOMMENDATIONS: If the polyp(s) removed today are proven to be adenomatous (pre-cancerous) polyps, you will need a repeat colonoscopy in 5 years.  Otherwise you should continue to follow colorectal cancer screening guidelines for "routine risk" patients with colonoscopy in 10 years.  You will receive a letter within 1-2 weeks with the results of your biopsy as well as final recommendations.  Please call my office if you have not received a letter after 3 weeks.   eSigned:  Louis Meckel, MD 12/19/2012 2:54 PM   cc:   PATIENT NAME:  Advit, Trethewey MR#: [id number]

## 2012-12-19 NOTE — Progress Notes (Signed)
Procedure ends, to recovery, eport given and VSS. 

## 2012-12-19 NOTE — Progress Notes (Signed)
Called to room to assist during endoscopic procedure.  Patient ID and intended procedure confirmed with present staff. Received instructions for my participation in the procedure from the performing physician.  

## 2012-12-19 NOTE — Progress Notes (Signed)
Patient did not experience any of the following events: a burn prior to discharge; a fall within the facility; wrong site/side/patient/procedure/implant event; or a hospital transfer or hospital admission upon discharge from the facility. (G8907) Patient did not have preoperative order for IV antibiotic SSI prophylaxis. (G8918)  

## 2012-12-19 NOTE — Patient Instructions (Signed)
Discharge instructions given with verbal understanding. Handouts on polyps and diverticulosis. Resume previous medications. YOU HAD AN ENDOSCOPIC PROCEDURE TODAY AT THE [facility]: Refer to the procedure report that was given to you for any specific questions about what was found during the examination.  If the procedure report does not answer your questions, please call your gastroenterologist to clarify.  If you requested that your care partner not be given the details of your procedure findings, then the procedure report has been included in a sealed envelope for you to review at your convenience later.  YOU SHOULD EXPECT: Some feelings of bloating in the abdomen. Passage of more gas than usual.  Walking can help get rid of the air that was put into your GI tract during the procedure and reduce the bloating. If you had a lower endoscopy (such as a colonoscopy or flexible sigmoidoscopy) you may notice spotting of blood in your stool or on the toilet paper. If you underwent a bowel prep for your procedure, then you may not have a normal bowel movement for a few days.  DIET: Your first meal following the procedure should be a light meal and then it is ok to progress to your normal diet.  A half-sandwich or bowl of soup is an example of a good first meal.  Heavy or fried foods are harder to digest and may make you feel nauseous or bloated.  Likewise meals heavy in dairy and vegetables can cause extra gas to form and this can also increase the bloating.  Drink plenty of fluids but you should avoid alcoholic beverages for 24 hours.  ACTIVITY: Your care partner should take you home directly after the procedure.  You should plan to take it easy, moving slowly for the rest of the day.  You can resume normal activity the day after the procedure however you should NOT DRIVE or use heavy machinery for 24 hours (because of the sedation medicines used during the test).    SYMPTOMS TO REPORT  IMMEDIATELY: A gastroenterologist can be reached at any hour.  During normal business hours, 8:30 AM to 5:00 PM Monday through Friday, call [phone].  After hours and on weekends, please call the GI answering service at [phone] who will take a message and have the physician on call contact you.   Following lower endoscopy (colonoscopy or flexible sigmoidoscopy):  Excessive amounts of blood in the stool  Significant tenderness or worsening of abdominal pains  Swelling of the abdomen that is new, acute  Fever of 100F or higher  FOLLOW UP: If any biopsies were taken you will be contacted by phone or by letter within the next 1-3 weeks.  Call your gastroenterologist if you have not heard about the biopsies in 3 weeks.  Our staff will call the home number listed on your records the next business day following your procedure to check on you and address any questions or concerns that you may have at that time regarding the information given to you following your procedure. This is a courtesy call and so if there is no answer at the home number and we have not heard from you through the emergency physician on call, we will assume that you have returned to your regular daily activities without incident.  SIGNATURES/CONFIDENTIALITY: You and/or your care partner have signed paperwork which will be entered into your electronic medical record.  These signatures attest to the fact that that the information above on your After Visit Summary   has been reviewed and is understood.  Full responsibility of the confidentiality of this discharge information lies with you and/or your care-partner. 

## 2012-12-20 ENCOUNTER — Telehealth: Payer: Self-pay

## 2012-12-20 NOTE — Telephone Encounter (Signed)
  Follow up Call-  Call back number 12/19/2012  Post procedure Call Back phone  # [phone]  Permission to leave phone message Yes     Patient questions:  Do you have a fever, pain , or abdominal swelling? no Pain Score  0 *  Have you tolerated food without any problems? yes  Have you been able to return to your normal activities? yes  Do you have any questions about your discharge instructions: Diet   no Medications  no Follow up visit  no  Do you have questions or concerns about your Care? no  Actions: * If pain score is 4 or above: No action needed, pain <4.  No problems per the pt. Maw

## 2012-12-25 ENCOUNTER — Encounter: Payer: Self-pay | Admitting: Gastroenterology

## 2013-01-05 ENCOUNTER — Other Ambulatory Visit: Payer: Self-pay | Admitting: Family Medicine

## 2013-01-25 ENCOUNTER — Other Ambulatory Visit (INDEPENDENT_AMBULATORY_CARE_PROVIDER_SITE_OTHER): Payer: 59

## 2013-01-25 DIAGNOSIS — Z0000 Encounter for general adult medical examination without abnormal findings: Secondary | ICD-10-CM

## 2013-01-25 LAB — POCT URINALYSIS DIPSTICK
BILIRUBIN UA: NEGATIVE
Glucose, UA: NEGATIVE
Ketones, UA: NEGATIVE
LEUKOCYTES UA: NEGATIVE
Nitrite, UA: NEGATIVE
PH UA: 6.5
Protein, UA: NEGATIVE
Spec Grav, UA: <=1.005
Urobilinogen, UA: 0.2

## 2013-01-25 LAB — LIPID PANEL
CHOLESTEROL: 164 mg/dL (ref 0–200)
HDL: 55.4 mg/dL (ref >39.00)
LDL Cholesterol: 92 mg/dL (ref 0–99)
Total CHOL/HDL Ratio: 3
Triglycerides: 85 mg/dL (ref 0.0–149.0)
VLDL: 17 mg/dL (ref 0.0–40.0)

## 2013-01-25 LAB — CBC WITH DIFFERENTIAL/PLATELET
BASOS ABS: 0 10*3/uL (ref 0.0–0.1)
Basophils Relative: 0.5 % (ref 0.0–3.0)
EOS ABS: 0.3 10*3/uL (ref 0.0–0.7)
Eosinophils Relative: 4.4 % (ref 0.0–5.0)
HCT: 45.3 % (ref 39.0–52.0)
Hemoglobin: 15.4 g/dL (ref 13.0–17.0)
LYMPHS PCT: 30.7 % (ref 12.0–46.0)
Lymphs Abs: 2.4 10*3/uL (ref 0.7–4.0)
MCHC: 34 g/dL (ref 30.0–36.0)
MCV: 91.1 fl (ref 78.0–100.0)
MONO ABS: 0.7 10*3/uL (ref 0.1–1.0)
Monocytes Relative: 9.3 % (ref 3.0–12.0)
NEUTROS PCT: 55.1 % (ref 43.0–77.0)
Neutro Abs: 4.2 10*3/uL (ref 1.4–7.7)
PLATELETS: 280 10*3/uL (ref 150.0–400.0)
RBC: 4.98 Mil/uL (ref 4.22–5.81)
RDW: 13.9 % (ref 11.5–14.6)
WBC: 7.7 10*3/uL (ref 4.5–10.5)

## 2013-01-25 LAB — HEPATIC FUNCTION PANEL
ALK PHOS: 46 U/L (ref 39–117)
ALT: 46 U/L (ref 0–53)
AST: 30 U/L (ref 0–37)
Albumin: 4.1 g/dL (ref 3.5–5.2)
BILIRUBIN DIRECT: 0.2 mg/dL (ref 0.0–0.3)
Total Bilirubin: 0.7 mg/dL (ref 0.3–1.2)
Total Protein: 6.9 g/dL (ref 6.0–8.3)

## 2013-01-25 LAB — BASIC METABOLIC PANEL
BUN: 11 mg/dL (ref 6–23)
CALCIUM: 9.7 mg/dL (ref 8.4–10.5)
CHLORIDE: 105 meq/L (ref 96–112)
CO2: 27 meq/L (ref 19–32)
CREATININE: 1.3 mg/dL (ref 0.4–1.5)
GFR: 75.33 mL/min (ref >60.00)
Glucose, Bld: 99 mg/dL (ref 70–99)
Potassium: 4.8 mEq/L (ref 3.5–5.1)
Sodium: 140 mEq/L (ref 135–145)

## 2013-01-25 LAB — TSH: TSH: 1.3 u[IU]/mL (ref 0.35–5.50)

## 2013-01-25 LAB — PSA: PSA: 2.34 ng/mL (ref 0.10–4.00)

## 2013-02-01 ENCOUNTER — Ambulatory Visit (INDEPENDENT_AMBULATORY_CARE_PROVIDER_SITE_OTHER): Payer: 59 | Admitting: Family Medicine

## 2013-02-01 ENCOUNTER — Encounter: Payer: Self-pay | Admitting: Family Medicine

## 2013-02-01 VITALS — BP 150/90 | HR 133 | Temp 100.6°F | Ht 76.0 in | Wt 265.0 lb

## 2013-02-01 DIAGNOSIS — B9789 Other viral agents as the cause of diseases classified elsewhere: Secondary | ICD-10-CM

## 2013-02-01 DIAGNOSIS — Z0000 Encounter for general adult medical examination without abnormal findings: Secondary | ICD-10-CM

## 2013-02-01 DIAGNOSIS — B349 Viral infection, unspecified: Secondary | ICD-10-CM

## 2013-02-01 MED ORDER — CLOTRIMAZOLE-BETAMETHASONE 1-0.05 % EX CREA: 1.0000 "application " | TOPICAL_CREAM | Freq: Two times a day (BID) | CUTANEOUS | Status: DC

## 2013-02-01 MED ORDER — ATORVASTATIN CALCIUM 20 MG PO TABS: ORAL_TABLET | ORAL | Status: DC

## 2013-02-01 MED ORDER — BUPROPION HCL ER (XL) 150 MG PO TB24: ORAL_TABLET | ORAL | Status: DC

## 2013-02-01 NOTE — Progress Notes (Signed)
Pre visit review using our clinic review tool, if applicable. No additional management support is needed unless otherwise documented below in the visit note. 

## 2013-02-01 NOTE — Progress Notes (Signed)
   Subjective:    Patient ID: Curtis Duran, male    DOB: 25-Oct-1955, 58 y.o.   MRN: [id number]  HPI 58 yr old male for a cpx. He had been doing well until 5 days ago when he developed a fever. Body aches, ST, and a dry cough. This continues today. No NVD.    Review of Systems  Constitutional: Positive for fever. Negative for chills, diaphoresis, activity change, appetite change, fatigue and unexpected weight change.  HENT: Positive for congestion. Negative for dental problem, drooling, ear discharge, ear pain, facial swelling, hearing loss, mouth sores, nosebleeds, postnasal drip, rhinorrhea and sinus pressure.   Eyes: Negative.   Respiratory: Positive for cough. Negative for apnea, choking, chest tightness, shortness of breath, wheezing and stridor.   Cardiovascular: Negative.   Gastrointestinal: Negative.   Genitourinary: Negative.   Musculoskeletal: Negative.   Skin: Negative.   Neurological: Negative.   Psychiatric/Behavioral: Negative.        Objective:   Physical Exam  Constitutional: He is oriented to person, place, and time. He appears well-developed and well-nourished. No distress.  HENT:  Head: Normocephalic and atraumatic.  Right Ear: External ear normal.  Left Ear: External ear normal.  Nose: Nose normal.  Mouth/Throat: Oropharynx is clear and moist. No oropharyngeal exudate.  Eyes: Conjunctivae and EOM are normal. Pupils are equal, round, and reactive to light. Right eye exhibits no discharge. Left eye exhibits no discharge. No scleral icterus.  Neck: Neck supple. No JVD present. No tracheal deviation present. No thyromegaly present.  Cardiovascular: Normal rate, normal heart sounds and intact distal pulses.  Exam reveals no gallop and no friction rub.   No murmur heard. Occasional ectopy. EKG normal with occasional PACs   Pulmonary/Chest: Effort normal and breath sounds normal. No respiratory distress. He has no wheezes. He has no rales. He exhibits no  tenderness.  Abdominal: Soft. Bowel sounds are normal. He exhibits no distension and no mass. There is no tenderness. There is no rebound and no guarding.  Genitourinary: Rectum normal, prostate normal and penis normal. Guaiac negative stool. No penile tenderness.  Musculoskeletal: Normal range of motion. He exhibits no edema and no tenderness.  Lymphadenopathy:    He has no cervical adenopathy.  Neurological: He is alert and oriented to person, place, and time. He has normal reflexes. No cranial nerve deficit. He exhibits normal muscle tone. Coordination normal.  Skin: Skin is warm and dry. No rash noted. He is not diaphoretic. No erythema. No pallor.  Psychiatric: He has a normal mood and affect. His behavior is normal. Judgment and thought content normal.          Assessment & Plan:  Well exam. He also has influenza. He will rest, drink fluids, and take Motrin prn.

## 2013-02-04 ENCOUNTER — Telehealth: Payer: Self-pay | Admitting: Family Medicine

## 2013-02-04 NOTE — Telephone Encounter (Signed)
Pt request refill of buPROPion (WELLBUTRIN XL) 150 MG 24 hr tablet Pt is out of med and [facility] it will not arrive before next week. Pt  [facility] get him through

## 2013-02-05 NOTE — Telephone Encounter (Signed)
I called in below script and spoke with pt.

## 2014-01-24 ENCOUNTER — Other Ambulatory Visit: Payer: Self-pay | Admitting: Family Medicine

## 2014-02-15 ENCOUNTER — Other Ambulatory Visit: Payer: Self-pay | Admitting: Family Medicine

## 2014-05-12 ENCOUNTER — Telehealth: Payer: Self-pay | Admitting: *Deleted

## 2014-05-12 NOTE — Telephone Encounter (Signed)
Noted.  Home care advise given.

## 2014-05-12 NOTE — Telephone Encounter (Signed)
PLEASE NOTE: All timestamps contained within this report are represented as [place] Standard Time. CONFIDENTIALTY NOTICE: This fax transmission is intended only for the addressee. It contains information that is legally privileged, confidential or otherwise protected from use or disclosure. If you are not the intended recipient, you are strictly prohibited from reviewing, disclosing, copying using or disseminating any of this information or taking any action in reliance on or regarding this information. If you have received this fax in error, please notify us immediately by telephone so that we can arrange for its return to [place]. Phone: [phone], Toll-Free: [phone], Fax: [phone] Page: 1 of 2 Call Id: [id number] [facility] - Client [place] Patient Name: Curtis Duran Gender: Male DOB: 1955-08-13 Age: 59 Y 3 M 16 D Return Phone Number: [phone] (Primary), [phone] (Secondary) Address: [address] City/State/Zip: [place] [address] Client [facility] - Client Client Site [facility] Day Physician [place], [place] Type Call Call Type Triage / Clinical Relationship To Patient Self Appointment Disposition EMR Appointment Not Necessary Info pasted into Epic No Return Phone Number [phone] (Primary) Chief Complaint Medication reaction Initial Comment Caller states he is having side affects from Rx. Has rash on buttocks. PreDisposition Home Care Nurse Assessment Nurse: Danelle Earthly, RN, Bing Quarry Date/Time Eilene Ghazi Time): 05/09/2014 4:52:19 PM Confirm and document reason for call. If symptomatic, describe symptoms. ---CALLER STATES HE HAS RASH AND BUPROPION IS RX ATORVASTATIN IS HIS OTHER MEDICATION. HAS GROIN BUMP AND ONE ON LEG. HAS RASH ON BACK THAT ITCHES. HAS BEEN ON MEDICATIONS FOR 1-2 YEARS. Has the patient traveled out of the country  within the last 30 days? ---No Does the patient require triage? ---Yes Related visit to physician within the last 2 weeks? ---No Does the PT have any chronic conditions? (i.e. diabetes, asthma, etc.) ---Yes List chronic conditions. ---HIGH CHOLESTEROL, ANXIETY. Guidelines Guideline Title Affirmed Question Affirmed Notes Nurse Date/Time Eilene Ghazi Time) Hives Widespread hives (all triage questions negative) Danelle Earthly, RN, Bing Quarry 05/09/2014 4:56:10 PM Disp. Time Eilene Ghazi Time) Disposition Final User 05/09/2014 5:01:14 PM Home Care Yes Danelle Earthly, RN, Bing Quarry Caller Understands: Yes Disagree/Comply: Comply PLEASE NOTE: All timestamps contained within this report are represented as [place] Standard Time. CONFIDENTIALTY NOTICE: This fax transmission is intended only for the addressee. It contains information that is legally privileged, confidential or otherwise protected from use or disclosure. If you are not the intended recipient, you are strictly prohibited from reviewing, disclosing, copying using or disseminating any of this information or taking any action in reliance on or regarding this information. If you have received this fax in error, please notify us immediately by telephone so that we can arrange for its return to [place]. Phone: [phone], Toll-Free: [phone], Fax: [phone] Page: 2 of 2 Call Id: [id number] Care Advice Given Per Guideline HOME CARE: You should be able to treat this at home. REASSURANCE: It sounds like mild hives. They are usually part of a viral infection. CETIRIZINE OR LORATADINE: * Take cetirizine (e.g., Zyrtec) or loratadine once each day for hives that itch. Continue taking it once a day until the hives are gone for 24 hours. * CETIRIZINE (e.g., Zyrtec): Adult dosage is 10 mg once each day. * LORATADINE (i.g., Alavert, Claritin): Adult dosage is 10 mg once each day. * Cetrizine and loratadine are newer (second generation) antihistamine medications that cause  less sedation than diphenhydramine (Benadryl). They have the added advantage of being long-acting (last up to  24 hours). * If you do not have cetrizine or loratadine, you can instead take diphendyramine (e.g., OTC Benadryl 25-50 mg PO) four times a day for hives that itch. CAUTION - ANTIHISTAMINES: * Examples include diphenhydramine (Benadryl) and chlorpheniramine (Chlortrimeton, Chlor-tripolon) * May cause sleepiness. Do not drink alcohol, drive or operate dangerous machinery while taking antihistamines. * Do not take these medicines if you have prostate problems. * Read the package instructions and warnings. COOL BATH: Take a cool bath for 10 minutes to relieve itching. (Caution: avoid any chill) Rub very itchy areas with an ice cube for 10 minutes. REMOVE ALLERGENS: Take a bath or shower if triggered by pollens or animal contact. Change clothes PREVENTION - AVOID ALLERGENS: * If you identify a substance that causes hives, avoid that substance in the future. EXPECTED COURSE: Hives from viral illness normally come and go for 3 or 4 days, then disappear. Most people get hives once. CALL BACK IF: * Severe hives or severe itching persist over 24 hours despite taking an antihistamine (e.g., Benadryl) * Hives last over 1 week * You become worse. CARE ADVICE given per Hives (Adult) guideline. After Care Instructions Given Call Event Type User Date / Time Description

## 2014-05-16 ENCOUNTER — Other Ambulatory Visit: Payer: Self-pay | Admitting: Family Medicine

## 2014-05-23 ENCOUNTER — Encounter: Payer: Self-pay | Admitting: Family Medicine

## 2014-05-23 ENCOUNTER — Ambulatory Visit (INDEPENDENT_AMBULATORY_CARE_PROVIDER_SITE_OTHER): Payer: 59 | Admitting: Family Medicine

## 2014-05-23 VITALS — BP 118/79 | HR 103 | Temp 99.0°F | Ht 76.0 in | Wt 261.0 lb

## 2014-05-23 DIAGNOSIS — L309 Dermatitis, unspecified: Secondary | ICD-10-CM

## 2014-05-23 DIAGNOSIS — D171 Benign lipomatous neoplasm of skin and subcutaneous tissue of trunk: Secondary | ICD-10-CM

## 2014-05-23 MED ORDER — TRIAMCINOLONE ACETONIDE 0.1 % EX CREA: 1.0000 "application " | TOPICAL_CREAM | Freq: Two times a day (BID) | CUTANEOUS | Status: DC

## 2014-05-23 NOTE — Progress Notes (Signed)
Pre visit review using our clinic review tool, if applicable. No additional management support is needed unless otherwise documented below in the visit note. 

## 2014-05-23 NOTE — Progress Notes (Signed)
   Subjective:    Patient ID: Curtis Duran, male    DOB: 1955-03-23, 59 y.o.   MRN: [id number]  HPI Here for 2 things. First he has had patches of itchy skin on the trunk and legs for about a year. Moisturizers and Benadryl cream do not help. Also he has had a large lipoma on the upper back and neck for several years. It has grown larger and is now painful at times, so he wants this removed.    Review of Systems  Constitutional: Negative.   Skin: Positive for rash.       Objective:   Physical Exam  Constitutional: He appears well-developed and well-nourished.  Musculoskeletal:  Large firm lipoma on the right posterior neck and right upper back  Skin:  There are patches of macular red scaly skin on the chest and on both lower legs          Assessment & Plan:  We will refer to Surgery for the lipoma. Use Triamcinolone cream on the eczema

## 2014-07-07 ENCOUNTER — Other Ambulatory Visit: Payer: Self-pay | Admitting: Surgery

## 2014-07-07 DIAGNOSIS — R222 Localized swelling, mass and lump, trunk: Secondary | ICD-10-CM

## 2014-07-19 ENCOUNTER — Inpatient Hospital Stay
Admission: RE | Admit: 2014-07-19 | Discharge: 2014-07-19 | Disposition: A | Discharge status: Home / Self Care | Payer: 59 | Source: Ambulatory Visit | Attending: Surgery | Admitting: Surgery

## 2014-08-02 ENCOUNTER — Ambulatory Visit
Admission: RE | Admit: 2014-08-02 | Discharge: 2014-08-02 | Disposition: A | Discharge status: Home / Self Care | Payer: 59 | Source: Ambulatory Visit | Attending: Surgery | Admitting: Surgery

## 2014-08-02 DIAGNOSIS — R222 Localized swelling, mass and lump, trunk: Secondary | ICD-10-CM

## 2014-08-02 MED ORDER — GADOBENATE DIMEGLUMINE 529 MG/ML IV SOLN
20.0000 mL | Freq: Once | INTRAVENOUS | Status: AC | PRN
  Administered 2014-08-02: 20 mL via INTRAVENOUS

## 2014-08-15 ENCOUNTER — Other Ambulatory Visit: Payer: Self-pay | Admitting: Family Medicine

## 2014-08-15 NOTE — Telephone Encounter (Signed)
Looks like pt is due for labs 

## 2014-10-10 DIAGNOSIS — Z0279 Encounter for issue of other medical certificate: Secondary | ICD-10-CM

## 2014-12-29 ENCOUNTER — Other Ambulatory Visit: Payer: Self-pay | Admitting: Family Medicine

## 2015-01-23 ENCOUNTER — Other Ambulatory Visit (INDEPENDENT_AMBULATORY_CARE_PROVIDER_SITE_OTHER): Payer: 59

## 2015-01-23 DIAGNOSIS — Z0000 Encounter for general adult medical examination without abnormal findings: Secondary | ICD-10-CM

## 2015-01-23 LAB — BASIC METABOLIC PANEL
BUN: 15 mg/dL (ref 6–23)
CO2: 29 mEq/L (ref 19–32)
Calcium: 9.6 mg/dL (ref 8.4–10.5)
Chloride: 99 mEq/L (ref 96–112)
Creatinine, Ser: 1.05 mg/dL (ref 0.40–1.50)
GFR: 92.35 mL/min (ref >60.00)
Glucose, Bld: 152 mg/dL — ABNORMAL HIGH (ref 70–99)
Potassium: 4.6 mEq/L (ref 3.5–5.1)
SODIUM: 139 meq/L (ref 135–145)

## 2015-01-23 LAB — HEPATIC FUNCTION PANEL
ALBUMIN: 3.9 g/dL (ref 3.5–5.2)
ALK PHOS: 128 U/L — AB (ref 39–117)
ALT: 173 U/L — ABNORMAL HIGH (ref 0–53)
AST: 69 U/L — AB (ref 0–37)
Bilirubin, Direct: 0.1 mg/dL (ref 0.0–0.3)
Total Bilirubin: 0.4 mg/dL (ref 0.2–1.2)
Total Protein: 7 g/dL (ref 6.0–8.3)

## 2015-01-23 LAB — CBC WITH DIFFERENTIAL/PLATELET
BASOS ABS: 0.1 10*3/uL (ref 0.0–0.1)
Basophils Relative: 0.6 % (ref 0.0–3.0)
Eosinophils Absolute: 0 10*3/uL (ref 0.0–0.7)
Eosinophils Relative: 0.1 % (ref 0.0–5.0)
HCT: 47.3 % (ref 39.0–52.0)
Hemoglobin: 15.6 g/dL (ref 13.0–17.0)
LYMPHS PCT: 11.3 % — AB (ref 12.0–46.0)
Lymphs Abs: 2.7 10*3/uL (ref 0.7–4.0)
MCHC: 33 g/dL (ref 30.0–36.0)
MCV: 92.7 fl (ref 78.0–100.0)
MONOS PCT: 5.6 % (ref 3.0–12.0)
Monocytes Absolute: 1.3 10*3/uL — ABNORMAL HIGH (ref 0.1–1.0)
Neutro Abs: 19.4 10*3/uL — ABNORMAL HIGH (ref 1.4–7.7)
Neutrophils Relative %: 82.4 % — ABNORMAL HIGH (ref 43.0–77.0)
Platelets: 414 10*3/uL — ABNORMAL HIGH (ref 150.0–400.0)
RBC: 5.11 Mil/uL (ref 4.22–5.81)
RDW: 13.6 % (ref 11.5–15.5)

## 2015-01-23 LAB — LIPID PANEL
Cholesterol: 254 mg/dL — ABNORMAL HIGH (ref 0–200)
HDL: 62.3 mg/dL (ref >39.00)
LDL Cholesterol: 171 mg/dL — ABNORMAL HIGH (ref 0–99)
NONHDL: 191.95
Total CHOL/HDL Ratio: 4
Triglycerides: 106 mg/dL (ref 0.0–149.0)
VLDL: 21.2 mg/dL (ref 0.0–40.0)

## 2015-01-23 LAB — PSA: PSA: 2.91 ng/mL (ref 0.10–4.00)

## 2015-01-23 LAB — TSH: TSH: 2.21 u[IU]/mL (ref 0.35–4.50)

## 2015-01-30 ENCOUNTER — Ambulatory Visit (INDEPENDENT_AMBULATORY_CARE_PROVIDER_SITE_OTHER): Payer: 59 | Admitting: Family Medicine

## 2015-01-30 ENCOUNTER — Encounter: Payer: Self-pay | Admitting: Family Medicine

## 2015-01-30 VITALS — BP 152/91 | HR 106 | Temp 98.5°F | Ht 76.0 in | Wt 258.0 lb

## 2015-01-30 DIAGNOSIS — Z0000 Encounter for general adult medical examination without abnormal findings: Secondary | ICD-10-CM

## 2015-01-30 DIAGNOSIS — R739 Hyperglycemia, unspecified: Secondary | ICD-10-CM

## 2015-01-30 DIAGNOSIS — J189 Pneumonia, unspecified organism: Secondary | ICD-10-CM

## 2015-01-30 LAB — HEMOGLOBIN A1C: HEMOGLOBIN A1C: 6.7 % — AB (ref 4.6–6.5)

## 2015-01-30 MED ORDER — DORZOLAMIDE HCL-TIMOLOL MAL 2-0.5 % OP SOLN: 1.0000 [drp] | Freq: Every day | OPHTHALMIC | Status: AC

## 2015-01-30 MED ORDER — ROSUVASTATIN CALCIUM 10 MG PO TABS: 10.0000 mg | ORAL_TABLET | Freq: Every day | ORAL | Status: DC

## 2015-01-30 NOTE — Progress Notes (Signed)
Pre visit review using our clinic review tool, if applicable. No additional management support is needed unless otherwise documented below in the visit note. 

## 2015-01-30 NOTE — Progress Notes (Signed)
Subjective:    Patient ID: Curtis Duran, male    DOB: 22-Aug-1955, 60 y.o.   MRN: [id number]  HPI 60 yr old male for a cpx, and also to follow up a left sided pneumonia. He ad been doing well until he developed chest pains and a cough on 01-20-15 and he went to an Urgent Care clinic on 01-21-15. He was diagnosed with "bronchitis and pneumonia" by exam and a CXR, and he was treated with a 10 day course of Levaquin along with a Prednisone taper. The taper ends today and the Levaquin will be finished tomorrow. He now feels much better, though he still has a mild dry cough. The fever has resolved. He has been back to work this week. He stopped taking Lipitor last year due to a rash, and he admits to not eating a healthy diet. His labs show a number of abnormalities, including an elevated WBC of 23 K. His liver enzymes were mildly elevated. His LDL has increased dramatically, and his fasting glucose was up to 152. He has never had an abnormal glucose reading, but he does have a strong family hx of diabetes. His depression is stable and he is pleased with the effects of Wellbutrin.    Review of Systems  Constitutional: Negative.   HENT: Negative.   Eyes: Negative.   Respiratory: Positive for cough. Negative for apnea, choking, chest tightness, shortness of breath, wheezing and stridor.   Cardiovascular: Negative.   Gastrointestinal: Negative.   Genitourinary: Negative.   Musculoskeletal: Negative.   Skin: Negative.   Neurological: Negative.   Psychiatric/Behavioral: Negative.        Objective:   Physical Exam  Constitutional: He is oriented to person, place, and time. He appears well-developed and well-nourished. No distress.  HENT:  Head: Normocephalic and atraumatic.  Right Ear: External ear normal.  Left Ear: External ear normal.  Nose: Nose normal.  Mouth/Throat: Oropharynx is clear and moist. No oropharyngeal exudate.  Eyes: Conjunctivae and EOM are normal. Pupils are equal, round,  and reactive to light. Right eye exhibits no discharge. Left eye exhibits no discharge. No scleral icterus.  Neck: Neck supple. No JVD present. No tracheal deviation present. No thyromegaly present.  Cardiovascular: Normal rate, regular rhythm, normal heart sounds and intact distal pulses.  Exam reveals no gallop and no friction rub.   No murmur heard. Pulmonary/Chest: Effort normal and breath sounds normal. No respiratory distress. He has no wheezes. He has no rales. He exhibits no tenderness.  Abdominal: Soft. Bowel sounds are normal. He exhibits no distension and no mass. There is no tenderness. There is no rebound and no guarding.  Genitourinary: Rectum normal, prostate normal and penis normal. Guaiac negative stool. No penile tenderness.  Musculoskeletal: Normal range of motion. He exhibits no edema or tenderness.  Lymphadenopathy:    He has no cervical adenopathy.  Neurological: He is alert and oriented to person, place, and time. He has normal reflexes. No cranial nerve deficit. He exhibits normal muscle tone. Coordination normal.  Skin: Skin is warm and dry. No rash noted. He is not diaphoretic. No erythema. No pallor.  Psychiatric: He has a normal mood and affect. His behavior is normal. Judgment and thought content normal.          Assessment & Plan:  Well exam. We discussed diet and exercise. His elevated glucose is probably the effect of the Prednisone, which is unmasking a genetic predisposition to diabetes. We will get a baseline A1c level today.  He will reduce his intake of carbohydrates, and we will recheck this in one month. His WBC is up due to the pneumonia and due to the steroids, I am sure, but we will recheck this in one month. We will recheck the liver enzymes in one month. For the lipids, he will try Crestor 10 mg daily, and we will recheck this in 3 months. The pneumonia seems to have resolved. He will finish out the antibiotic.

## 2015-03-06 ENCOUNTER — Encounter: Payer: Self-pay | Admitting: Family Medicine

## 2015-03-06 ENCOUNTER — Ambulatory Visit (INDEPENDENT_AMBULATORY_CARE_PROVIDER_SITE_OTHER): Payer: 59 | Admitting: Family Medicine

## 2015-03-06 VITALS — BP 122/78 | HR 99 | Temp 98.5°F | Ht 76.0 in | Wt 257.0 lb

## 2015-03-06 DIAGNOSIS — D72829 Elevated white blood cell count, unspecified: Secondary | ICD-10-CM

## 2015-03-06 DIAGNOSIS — R740 Nonspecific elevation of levels of transaminase and lactic acid dehydrogenase [LDH]: Secondary | ICD-10-CM | POA: Diagnosis not present

## 2015-03-06 DIAGNOSIS — R7401 Elevation of levels of liver transaminase levels: Secondary | ICD-10-CM

## 2015-03-06 DIAGNOSIS — E119 Type 2 diabetes mellitus without complications: Secondary | ICD-10-CM | POA: Diagnosis not present

## 2015-03-06 LAB — BASIC METABOLIC PANEL
BUN: 16 mg/dL (ref 6–23)
CHLORIDE: 108 meq/L (ref 96–112)
CO2: 26 meq/L (ref 19–32)
Calcium: 9.1 mg/dL (ref 8.4–10.5)
Creatinine, Ser: 1.16 mg/dL (ref 0.40–1.50)
GFR: 82.29 mL/min (ref >60.00)
Glucose, Bld: 98 mg/dL (ref 70–99)
Potassium: 4.1 mEq/L (ref 3.5–5.1)
SODIUM: 142 meq/L (ref 135–145)

## 2015-03-06 LAB — CBC WITH DIFFERENTIAL/PLATELET
BASOS ABS: 0 10*3/uL (ref 0.0–0.1)
Basophils Relative: 0.5 % (ref 0.0–3.0)
EOS ABS: 0.4 10*3/uL (ref 0.0–0.7)
Eosinophils Relative: 5.5 % — ABNORMAL HIGH (ref 0.0–5.0)
HCT: 42.5 % (ref 39.0–52.0)
Hemoglobin: 14.4 g/dL (ref 13.0–17.0)
Lymphocytes Relative: 29.3 % (ref 12.0–46.0)
Lymphs Abs: 2.3 10*3/uL (ref 0.7–4.0)
MCHC: 33.9 g/dL (ref 30.0–36.0)
MCV: 90.4 fl (ref 78.0–100.0)
MONOS PCT: 10 % (ref 3.0–12.0)
Monocytes Absolute: 0.8 10*3/uL (ref 0.1–1.0)
NEUTROS ABS: 4.3 10*3/uL (ref 1.4–7.7)
Neutrophils Relative %: 54.7 % (ref 43.0–77.0)
PLATELETS: 291 10*3/uL (ref 150.0–400.0)
RBC: 4.71 Mil/uL (ref 4.22–5.81)
RDW: 14.4 % (ref 11.5–15.5)
WBC: 7.9 10*3/uL (ref 4.0–10.5)

## 2015-03-06 LAB — HEPATIC FUNCTION PANEL
ALK PHOS: 66 U/L (ref 39–117)
ALT: 62 U/L — AB (ref 0–53)
AST: 43 U/L — ABNORMAL HIGH (ref 0–37)
Albumin: 4.2 g/dL (ref 3.5–5.2)
BILIRUBIN DIRECT: 0.1 mg/dL (ref 0.0–0.3)
TOTAL PROTEIN: 6.8 g/dL (ref 6.0–8.3)
Total Bilirubin: 0.3 mg/dL (ref 0.2–1.2)

## 2015-03-06 NOTE — Progress Notes (Signed)
   Subjective:    Patient ID: Curtis Duran, male    DOB: 11-09-1955, 60 y.o.   MRN: [id number]  HPI Here to follow up on newly diagnosed diabetes, elevated WBC, and elevated liver enzymes. One month ago he was getting over a pneumonia and we felt these were temporary lab changes. He feels great now, no cough or SOB. He has reduced is carb intake. BP is stable. His A1c last month was 6.7.    Review of Systems  Constitutional: Negative.   Respiratory: Negative.   Cardiovascular: Negative.   Endocrine: Negative.   Neurological: Negative.        Objective:   Physical Exam  Constitutional: He is oriented to person, place, and time. He appears well-developed and well-nourished.  Neck: No thyromegaly present.  Cardiovascular: Normal rate, regular rhythm, normal heart sounds and intact distal pulses.   Pulmonary/Chest: Effort normal and breath sounds normal. No respiratory distress. He has no wheezes. He has no rales.  Lymphadenopathy:    He has no cervical adenopathy.  Neurological: He is alert and oriented to person, place, and time.          Assessment & Plan:  He has recovered from pneumonia. We will get a BMET today to recheck the glucose. Recheck a CBC. Recheck a liver panel.

## 2015-03-06 NOTE — Progress Notes (Signed)
Pre visit review using our clinic review tool, if applicable. No additional management support is needed unless otherwise documented below in the visit note. 

## 2015-04-27 ENCOUNTER — Other Ambulatory Visit: Payer: Self-pay | Admitting: Family Medicine

## 2015-04-27 MED ORDER — ROSUVASTATIN CALCIUM 10 MG PO TABS: 10.0000 mg | ORAL_TABLET | Freq: Every day | ORAL | Status: DC

## 2015-08-10 NOTE — Progress Notes (Signed)
   Subjective:    Patient ID: Curtis Duran, male    DOB: 10-06-1955, 60 y.o.   MRN: [id number]  HPI error   Review of Systems     Objective:   Physical Exam        Assessment & Plan:

## 2015-10-19 ENCOUNTER — Ambulatory Visit: Payer: Self-pay | Admitting: Surgery

## 2015-10-19 NOTE — H&P (Signed)
Jasmine December 10/19/2015 2:29 PM Location: [facility] Patient #: [id number] DOB: [birth year] Married / Language: English / Race: Black or African American Male  History of Present Illness Marcello Moores A. Aubry Tucholski MD; 10/19/2015 4:52 PM) Patient words: Patient returns for follow-up of his large lipoma right upper back. He is ready to schedule surgery due to its size and discomfort.                   CLINICAL DATA: Lipoma on the upper back/ lower neck for 5 years. Interval growth.  EXAM: MRI CHEST WITHOUT AND WITH CONTRAST  TECHNIQUE: Multiplanar and multisequence MRI of the upper chest was performed prior to and following intravenous contrast.  CONTRAST: 19mL MULTIHANCE GADOBENATE DIMEGLUMINE 529 MG/ML IV SOLN  COMPARISON: None.  FINDINGS: There is a 3.7 x 11.7 x 12.6 cm T1 hyperintense mass in the upper back/lower neck, to the right of midline with signal dropout on fat saturated images. There is no abnormal enhancing nodule or thickened septa. There are a few thin internal septations. The appearance is most consistent with a simple lipoma. The lipoma is located within the subcutaneous fat.  The visualized osseous structures demonstrate no focal marrow signal abnormality. The muscles are normal in signal. The muscles enhance normally and homogeneously. There is no focal fluid collection or hematoma.  The visualized cervical spine demonstrates a mild broad-based disc bulge at C3-4. There is mild left foraminal stenosis at C3-4. The vertebral body heights are maintained and are in normal anatomic alignment.  IMPRESSION: 1. Large simple lipoma of the upper back/lower neck to the right of midline without areas of abnormal enhancement.   Electronically Signed By: Kathreen Devoid On: 08/02/2014 17:18.  The patient is a 60 year old male.   Allergies (Sonya Bynum, CMA; 10/19/2015 2:29 PM) Citalopram Hydrobromide *ANTIDEPRESSANTS*  Rash.  Medication History Marjean Donna, CMA; 10/19/2015 2:29 PM) Atorvastatin Calcium (20MG  Tablet, Oral) Active. Lumigan (0.01% Solution, Ophthalmic) Active. BuPROPion HCl ER (XL) (150MG  Tablet ER 24HR, Oral) Active. Triamcinolone Acetonide (0.1% Cream, External) Active. Medications Reconciled    Vitals (Sonya Bynum CMA; 10/19/2015 2:29 PM) 10/19/2015 2:29 PM Weight: 261 lb Height: 76in Body Surface Area: 2.48 m Body Mass Index: 31.77 kg/m  Temp.: 97.23F(Temporal)  Pulse: 82 (Regular)  BP: 126/80 (Sitting, Left Arm, Standard)      Physical Exam (Renesmae Donahey A. Mishell Donalson MD; 10/19/2015 4:52 PM)  General Mental Status-Alert. General Appearance-Consistent with stated age. Hydration-Well hydrated. Voice-Normal.  Integumentary Note: Transabdominal lipoma over his right shoulder to the base of his neck region.  Chest and Lung Exam Chest and lung exam reveals -quiet, even and easy respiratory effort with no use of accessory muscles and on auscultation, normal breath sounds, no adventitious sounds and normal vocal resonance. Inspection Chest Wall - Normal. Back - normal.  Musculoskeletal Normal Exam - Left-Upper Extremity Strength Normal and Lower Extremity Strength Normal. Normal Exam - Right-Upper Extremity Strength Normal, Lower Extremity Weakness.    Assessment & Plan (Lajoy Vanamburg A. Tavarion Babington MD; 10/19/2015 4:53 PM)  LIPOMA OF BACK (D17.1) Impression: MRI shows lipoma no complicating features  ready to schedule surgery     Risk of bleeding, infection, nerve injury, blood vessel injury, recurrence, functional limitations after surgery, the use of a drain, and potential other operations, treatments and/or procedures discussed with patient. The complications of blood clots, heart attack, stroke, and the need for other medical treatments discussed. He agrees to proceed.  Current Plans The pathophysiology of skin & subcutaneous masses was discussed.  Natural history risks without surgery were discussed. I recommended surgery to remove the mass. I explained the technique of removal with use of local anesthesia & possible need for more aggressive sedation/anesthesia for patient comfort.  Risks such as bleeding, infection, wound breakdown, heart attack, death, and other risks were discussed. I noted a good likelihood this will help address the problem. Possibility that this will not correct all symptoms was explained. Possibility of regrowth/recurrence of the mass was discussed. We will work to minimize complications. Questions were answered. The patient expresses understanding & wishes to proceed with surgery.  Pt Education - [facility] Free Text Education/Instructions: discussed with patient and provided information.

## 2015-11-23 ENCOUNTER — Encounter (HOSPITAL_COMMUNITY)
Admission: RE | Admit: 2015-11-23 | Discharge: 2015-11-23 | Disposition: A | Discharge status: Home / Self Care | Payer: 59 | Source: Ambulatory Visit | Attending: Surgery | Admitting: Surgery

## 2015-11-23 ENCOUNTER — Encounter (HOSPITAL_COMMUNITY): Payer: Self-pay

## 2015-11-23 DIAGNOSIS — Z01818 Encounter for other preprocedural examination: Secondary | ICD-10-CM | POA: Insufficient documentation

## 2015-11-23 DIAGNOSIS — I491 Atrial premature depolarization: Secondary | ICD-10-CM | POA: Insufficient documentation

## 2015-11-23 DIAGNOSIS — D171 Benign lipomatous neoplasm of skin and subcutaneous tissue of trunk: Secondary | ICD-10-CM | POA: Diagnosis not present

## 2015-11-23 DIAGNOSIS — Z01810 Encounter for preprocedural cardiovascular examination: Secondary | ICD-10-CM | POA: Insufficient documentation

## 2015-11-23 HISTORY — DX: Carpal tunnel syndrome, bilateral upper limbs: G56.03

## 2015-11-23 HISTORY — DX: Bronchopneumonia, unspecified organism: J18.0

## 2015-11-23 HISTORY — DX: Dermatitis, unspecified: L30.9

## 2015-11-23 LAB — CBC WITH DIFFERENTIAL/PLATELET
BASOS ABS: 0.1 10*3/uL (ref 0.0–0.1)
Basophils Relative: 1 %
EOS PCT: 9 %
Eosinophils Absolute: 0.8 10*3/uL — ABNORMAL HIGH (ref 0.0–0.7)
HCT: 46.4 % (ref 39.0–52.0)
Hemoglobin: 15.6 g/dL (ref 13.0–17.0)
LYMPHS ABS: 2.5 10*3/uL (ref 0.7–4.0)
Lymphocytes Relative: 31 %
MCH: 30.6 pg (ref 26.0–34.0)
MCHC: 33.6 g/dL (ref 30.0–36.0)
MCV: 91 fL (ref 78.0–100.0)
MONO ABS: 0.7 10*3/uL (ref 0.1–1.0)
MONOS PCT: 9 %
Neutro Abs: 4.1 10*3/uL (ref 1.7–7.7)
Neutrophils Relative %: 50 %
PLATELETS: 250 10*3/uL (ref 150–400)
RBC: 5.1 MIL/uL (ref 4.22–5.81)
RDW: 13.1 % (ref 11.5–15.5)
WBC: 8 10*3/uL (ref 4.0–10.5)

## 2015-11-23 LAB — COMPREHENSIVE METABOLIC PANEL
ALT: 34 U/L (ref 17–63)
AST: 24 U/L (ref 15–41)
Albumin: 3.9 g/dL (ref 3.5–5.0)
Alkaline Phosphatase: 53 U/L (ref 38–126)
Anion gap: 10 (ref 5–15)
BUN: 15 mg/dL (ref 6–20)
CO2: 23 mmol/L (ref 22–32)
CREATININE: 1.13 mg/dL (ref 0.61–1.24)
Calcium: 9.8 mg/dL (ref 8.9–10.3)
Chloride: 107 mmol/L (ref 101–111)
GFR calc Af Amer: >60 mL/min (ref >60)
Glucose, Bld: 114 mg/dL — ABNORMAL HIGH (ref 65–99)
POTASSIUM: 4.2 mmol/L (ref 3.5–5.1)
Sodium: 140 mmol/L (ref 135–145)
Total Bilirubin: 0.4 mg/dL (ref 0.3–1.2)
Total Protein: 6.4 g/dL — ABNORMAL LOW (ref 6.5–8.1)

## 2015-11-23 NOTE — Pre-Procedure Instructions (Signed)
JORAH GROSSE  11/23/2015      [id number] [place], [place] [id number] [place] [id number] Phone: [phone] Fax: [phone]  [place] [place] [address] [place] [address] Phone: [phone] Fax: [phone]    Your procedure is scheduled on Tuesday, November 14th   Report to [facility] at 5:30 A.M.             (Posted surgery time 7:30 - 9:00 AM)   Call this number if you have problems the MORNING of surgery:  [phone]  (any other questions, call [phone] between hours of 8 - 6 pm)   Remember:  Do not eat food or drink liquids after midnight Monday.   Take these medicines the morning of surgery with A SIP OF WATER : Wellbutrin XL.  May use eye drops the morning of surgery.             4 -5 days prior to surgery, STOP taking herbal supplements, vitamins, anti-inflammatories   Do not wear jewelry, no rings or watches.  Do not wear lotions, colognes, and deoderant.              Men may shave face and neck.  Do not bring valuables to the hospital.  [facility] is not responsible for any belongings or valuables.  Contacts, dentures or bridgework may not be worn into surgery.  Leave your suitcase in the car.  After surgery it may be brought to your room.  Patients discharged the day of surgery will not be allowed to drive home.   Please read over the following fact sheets that you were given. Pain Booklet and Surgical Site Infection Prevention

## 2015-11-23 NOTE — Progress Notes (Signed)
   11/23/15 1018  OBSTRUCTIVE SLEEP APNEA  Have you ever been diagnosed with sleep apnea through a sleep study? No  Do you snore loudly (loud enough to be heard through closed doors)?  0  Do you often feel tired, fatigued, or sleepy during the daytime (such as falling asleep during driving or talking to someone)? 0  Has anyone observed you stop breathing during your sleep? 0  Do you have, or are you being treated for high blood pressure? 0  BMI more than 35 kg/m2? 1  Age > 50 (1-yes) 1  Neck circumference greater than:Male 16 inches or larger, Male 17inches or larger? 1  Male Gender (Yes=1) 1  Obstructive Sleep Apnea Score 4

## 2015-11-30 MED ORDER — DEXTROSE 5 % IV SOLN
3.0000 g | INTRAVENOUS | Status: AC
  Administered 2015-12-01: 3 g via INTRAVENOUS
  Filled 2015-11-30: qty 3000

## 2015-11-30 NOTE — Anesthesia Preprocedure Evaluation (Addendum)
Anesthesia Evaluation  Patient identified by MRN, date of birth, ID band Patient awake    Reviewed: Allergy & Precautions, H&P , NPO status , Patient's Chart, lab work & pertinent test results  History of Anesthesia Complications Negative for: history of anesthetic complications  Airway Mallampati: II  TM Distance: >3 FB Neck ROM: full    Dental  (+) Teeth Intact, Dental Advidsory Given   Pulmonary neg pulmonary ROS,    breath sounds clear to auscultation       Cardiovascular negative cardio ROS   Rhythm:Regular Rate:Normal     Neuro/Psych  Headaches, Depression    GI/Hepatic Neg liver ROS, GERD  Poorly Controlled,  Endo/Other  Morbid obesity  Renal/GU negative Renal ROS     Musculoskeletal   Abdominal (+) + obese,   Peds  Hematology negative hematology ROS (+)   Anesthesia Other Findings   Reproductive/Obstetrics                           Anesthesia Physical Anesthesia Plan  ASA: II  Anesthesia Plan:    Post-op Pain Management:    Induction: Intravenous and Rapid sequence  Airway Management Planned: Oral ETT  Additional Equipment:   Intra-op Plan:   Post-operative Plan: Extubation in OR  Informed Consent: I have reviewed the patients History and Physical, chart, labs and discussed the procedure including the risks, benefits and alternatives for the proposed anesthesia with the patient or authorized representative who has indicated his/her understanding and acceptance.   Dental Advisory Given  Plan Discussed with: Anesthesiologist, CRNA and Surgeon  Anesthesia Plan Comments: (Plan routine monitors, GETA)       Anesthesia Quick Evaluation

## 2015-12-01 ENCOUNTER — Ambulatory Visit (HOSPITAL_COMMUNITY): Payer: 59 | Admitting: Anesthesiology

## 2015-12-01 ENCOUNTER — Ambulatory Visit (HOSPITAL_COMMUNITY)
Admission: RE | Admit: 2015-12-01 | Discharge: 2015-12-01 | Disposition: A | Discharge status: Home / Self Care | Payer: 59 | Source: Ambulatory Visit | Attending: Surgery | Admitting: Surgery

## 2015-12-01 ENCOUNTER — Encounter (HOSPITAL_COMMUNITY)
Admission: RE | Disposition: A | Discharge status: Home / Self Care | Payer: Self-pay | Source: Ambulatory Visit | Attending: Surgery

## 2015-12-01 ENCOUNTER — Encounter (HOSPITAL_COMMUNITY): Payer: Self-pay | Admitting: Anesthesiology

## 2015-12-01 DIAGNOSIS — Z6831 Body mass index (BMI) 31.0-31.9, adult: Secondary | ICD-10-CM | POA: Insufficient documentation

## 2015-12-01 DIAGNOSIS — D1721 Benign lipomatous neoplasm of skin and subcutaneous tissue of right arm: Secondary | ICD-10-CM | POA: Diagnosis not present

## 2015-12-01 DIAGNOSIS — F329 Major depressive disorder, single episode, unspecified: Secondary | ICD-10-CM | POA: Insufficient documentation

## 2015-12-01 DIAGNOSIS — Z79899 Other long term (current) drug therapy: Secondary | ICD-10-CM | POA: Insufficient documentation

## 2015-12-01 HISTORY — PX: LIPOMA EXCISION: SHX5283

## 2015-12-01 SURGERY — EXCISION LIPOMA
Anesthesia: General | Site: Back

## 2015-12-01 MED ORDER — FENTANYL CITRATE (PF) 100 MCG/2ML IJ SOLN
INTRAMUSCULAR | Status: AC
  Filled 2015-12-01: qty 2

## 2015-12-01 MED ORDER — DEXAMETHASONE SODIUM PHOSPHATE 10 MG/ML IJ SOLN
INTRAMUSCULAR | Status: AC
  Filled 2015-12-01: qty 1

## 2015-12-01 MED ORDER — MEPERIDINE HCL 25 MG/ML IJ SOLN: 6.2500 mg | INTRAMUSCULAR | Status: DC | PRN

## 2015-12-01 MED ORDER — MIDAZOLAM HCL 2 MG/2ML IJ SOLN
INTRAMUSCULAR | Status: AC
  Filled 2015-12-01: qty 2

## 2015-12-01 MED ORDER — PROMETHAZINE HCL 25 MG/ML IJ SOLN: 6.2500 mg | INTRAMUSCULAR | Status: DC | PRN

## 2015-12-01 MED ORDER — PROPOFOL 10 MG/ML IV BOLUS
INTRAVENOUS | Status: AC
  Filled 2015-12-01: qty 20

## 2015-12-01 MED ORDER — OXYCODONE-ACETAMINOPHEN 5-325 MG PO TABS: 1.0000 | ORAL_TABLET | ORAL | 0 refills | Status: DC | PRN

## 2015-12-01 MED ORDER — FENTANYL CITRATE (PF) 100 MCG/2ML IJ SOLN
INTRAMUSCULAR | Status: DC | PRN
  Administered 2015-12-01: 50 ug via INTRAVENOUS
  Administered 2015-12-01: 100 ug via INTRAVENOUS

## 2015-12-01 MED ORDER — SUGAMMADEX SODIUM 500 MG/5ML IV SOLN
INTRAVENOUS | Status: AC
  Filled 2015-12-01: qty 5

## 2015-12-01 MED ORDER — MIDAZOLAM HCL 5 MG/5ML IJ SOLN
INTRAMUSCULAR | Status: DC | PRN
  Administered 2015-12-01: 2 mg via INTRAVENOUS

## 2015-12-01 MED ORDER — PHENYLEPHRINE HCL 10 MG/ML IJ SOLN
INTRAMUSCULAR | Status: DC | PRN
  Administered 2015-12-01: 160 ug via INTRAVENOUS
  Administered 2015-12-01 (×2): 80 ug via INTRAVENOUS

## 2015-12-01 MED ORDER — MIDAZOLAM HCL 2 MG/2ML IJ SOLN: 0.5000 mg | Freq: Once | INTRAMUSCULAR | Status: DC | PRN

## 2015-12-01 MED ORDER — BUPIVACAINE HCL (PF) 0.25 % IJ SOLN
INTRAMUSCULAR | Status: DC | PRN
  Administered 2015-12-01: 30 mL

## 2015-12-01 MED ORDER — SUGAMMADEX SODIUM 500 MG/5ML IV SOLN
INTRAVENOUS | Status: DC | PRN
  Administered 2015-12-01: 350 mg via INTRAVENOUS

## 2015-12-01 MED ORDER — CHLORHEXIDINE GLUCONATE CLOTH 2 % EX PADS: 6.0000 | MEDICATED_PAD | Freq: Once | CUTANEOUS | Status: DC

## 2015-12-01 MED ORDER — ONDANSETRON HCL 4 MG/2ML IJ SOLN
INTRAMUSCULAR | Status: DC | PRN
  Administered 2015-12-01: 4 mg via INTRAVENOUS

## 2015-12-01 MED ORDER — 0.9 % SODIUM CHLORIDE (POUR BTL) OPTIME
TOPICAL | Status: DC | PRN
  Administered 2015-12-01: 1000 mL

## 2015-12-01 MED ORDER — LIDOCAINE HCL (CARDIAC) 20 MG/ML IV SOLN
INTRAVENOUS | Status: DC | PRN
  Administered 2015-12-01: 20 mg via INTRAVENOUS

## 2015-12-01 MED ORDER — FENTANYL CITRATE (PF) 100 MCG/2ML IJ SOLN: 25.0000 ug | INTRAMUSCULAR | Status: DC | PRN

## 2015-12-01 MED ORDER — BUPIVACAINE HCL (PF) 0.25 % IJ SOLN
INTRAMUSCULAR | Status: AC
  Filled 2015-12-01: qty 30

## 2015-12-01 MED ORDER — EPHEDRINE SULFATE 50 MG/ML IJ SOLN
INTRAMUSCULAR | Status: DC | PRN
  Administered 2015-12-01 (×2): 10 mg via INTRAVENOUS

## 2015-12-01 MED ORDER — PROPOFOL 10 MG/ML IV BOLUS
INTRAVENOUS | Status: DC | PRN
  Administered 2015-12-01: 150 mg via INTRAVENOUS

## 2015-12-01 MED ORDER — LACTATED RINGERS IV SOLN
INTRAVENOUS | Status: DC | PRN
  Administered 2015-12-01 (×2): via INTRAVENOUS

## 2015-12-01 MED ORDER — ROCURONIUM BROMIDE 100 MG/10ML IV SOLN
INTRAVENOUS | Status: DC | PRN
  Administered 2015-12-01: 50 mg via INTRAVENOUS

## 2015-12-01 SURGICAL SUPPLY — 46 items
ADH SKN CLS APL DERMABOND .7 (GAUZE/BANDAGES/DRESSINGS) ×1
BIOPATCH RED 1 DISK 7.0 (GAUZE/BANDAGES/DRESSINGS) ×2 IMPLANT
BLADE SURG 15 STRL LF DISP TIS (BLADE) ×1 IMPLANT
BLADE SURG 15 STRL SS (BLADE) ×2
CHLORAPREP W/TINT 26ML (MISCELLANEOUS) ×2 IMPLANT
COVER SURGICAL LIGHT HANDLE (MISCELLANEOUS) ×2 IMPLANT
DECANTER SPIKE VIAL GLASS SM (MISCELLANEOUS) ×2 IMPLANT
DERMABOND ADVANCED (GAUZE/BANDAGES/DRESSINGS) ×1
DERMABOND ADVANCED .7 DNX12 (GAUZE/BANDAGES/DRESSINGS) ×1 IMPLANT
DRAIN CHANNEL 19F RND (DRAIN) ×2 IMPLANT
DRAPE LAPAROSCOPIC ABDOMINAL (DRAPES) ×2 IMPLANT
DRAPE UTILITY XL STRL (DRAPES) ×4 IMPLANT
DRSG TEGADERM 2-3/8X2-3/4 SM (GAUZE/BANDAGES/DRESSINGS) ×2 IMPLANT
ELECT CAUTERY BLADE 6.4 (BLADE) ×2 IMPLANT
ELECT REM PT RETURN 9FT ADLT (ELECTROSURGICAL) ×2
ELECTRODE REM PT RTRN 9FT ADLT (ELECTROSURGICAL) ×1 IMPLANT
EVACUATOR SILICONE 100CC (DRAIN) ×2 IMPLANT
GLOVE BIO SURGEON STRL SZ8 (GLOVE) ×2 IMPLANT
GLOVE BIOGEL PI IND STRL 8 (GLOVE) ×1 IMPLANT
GLOVE BIOGEL PI INDICATOR 8 (GLOVE) ×1
GOWN STRL REUS W/ TWL LRG LVL3 (GOWN DISPOSABLE) ×2 IMPLANT
GOWN STRL REUS W/ TWL XL LVL3 (GOWN DISPOSABLE) ×1 IMPLANT
GOWN STRL REUS W/TWL LRG LVL3 (GOWN DISPOSABLE) ×4
GOWN STRL REUS W/TWL XL LVL3 (GOWN DISPOSABLE) ×2
KIT BASIN OR (CUSTOM PROCEDURE TRAY) ×2 IMPLANT
KIT ROOM TURNOVER OR (KITS) ×2 IMPLANT
NEEDLE HYPO 25GX1X1/2 BEV (NEEDLE) ×2 IMPLANT
NS IRRIG 1000ML POUR BTL (IV SOLUTION) ×2 IMPLANT
PACK SURGICAL SETUP 50X90 (CUSTOM PROCEDURE TRAY) ×2 IMPLANT
PAD ARMBOARD 7.5X6 YLW CONV (MISCELLANEOUS) ×2 IMPLANT
PENCIL BUTTON HOLSTER BLD 10FT (ELECTRODE) ×2 IMPLANT
SPECIMEN JAR MEDIUM (MISCELLANEOUS) ×2 IMPLANT
SPONGE LAP 18X18 X RAY DECT (DISPOSABLE) ×2 IMPLANT
SUT ETHILON 2 0 FS 18 (SUTURE) ×2 IMPLANT
SUT MNCRL AB 3-0 PS2 18 (SUTURE) ×2 IMPLANT
SUT MNCRL AB 4-0 PS2 18 (SUTURE) ×2 IMPLANT
SUT VIC AB 0 CT1 27 (SUTURE) ×2
SUT VIC AB 0 CT1 27XBRD ANBCTR (SUTURE) ×1 IMPLANT
SUT VIC AB 2-0 SH 27 (SUTURE) ×2
SUT VIC AB 2-0 SH 27X BRD (SUTURE) ×1 IMPLANT
SUT VIC AB 3-0 SH 27 (SUTURE) ×2
SUT VIC AB 3-0 SH 27XBRD (SUTURE) ×1 IMPLANT
SYR BULB IRRIGATION 50ML (SYRINGE) ×2 IMPLANT
SYR CONTROL 10ML LL (SYRINGE) ×2 IMPLANT
TOWEL OR 17X24 6PK STRL BLUE (TOWEL DISPOSABLE) ×2 IMPLANT
TOWEL OR 17X26 10 PK STRL BLUE (TOWEL DISPOSABLE) ×2 IMPLANT

## 2015-12-01 NOTE — Op Note (Signed)
NAMETABARI, VOLKERT NO.:  [id number]  MEDICAL RECORD NO.:  [id number]  LOCATION:  [facility]                         FACILITY:  [facility]  PHYSICIAN:  Marcello Moores A. Jhonatan Lomeli, M.D.DATE OF BIRTH:  [birth year]  DATE OF PROCEDURE:  12/01/2015 DATE OF DISCHARGE:                              OPERATIVE REPORT   PREOPERATIVE DIAGNOSIS:  Large complex right shoulder subcutaneous lipoma, measuring 11 x 14 cm.  POSTOPERATIVE DIAGNOSIS:  Large complex right shoulder subcutaneous lipoma, measuring 11 x 14 cm.  PROCEDURE:  Excision of large complex subcutaneous lipoma from the patient's right shoulder, measuring 11 x 14 cm.  SURGEON:  Erroll Luna, MD.  ANESTHESIA:  General with 0.25% Sensorcaine plain.  EBL:  Minimal.  SPECIMEN:  Lipoma to pathology.  DRAINS:  A 19 Blake drain to subcutaneous space.  EBL:  Minimal.  INDICATIONS FOR PROCEDURE:  The patient is a 60 year old male with an enlarging right shoulder mass.  MRI showed this to be a subcutaneous lipoma.  He desired excision due to discomfort and increasing size. Risk of bleeding, infection, nerve injury, blood vessel injury, loss of function of shoulder region and musculature, use of drain, recurrence, numbness, and the need for other operative procedures and/or treatment is discussed.  DESCRIPTION OF PROCEDURE:  The patient was met in the holding area.  The mass was marked with the assistance of the patient clearly in the holding area.  This is over his upper back, just to the right of his midline at the base of his neck.  Questions were answered.  He was taken back to the operating room and placed supine on the OR table.  After induction of anesthesia, he was placed with his left side down in the bean bag after intubation.  The area overlying his right shoulder was prepped and draped in a sterile fashion.  A time-out was done.  Local anesthetic was infiltrated into the skin.  An 8 cm incision was made over the  mass.  Dissection was carried down to the subcutaneous fat and a multiloculated lipoma was identified, which extended down to the trapezius muscle.  All loculations were removed in their entirety.  The cavity was irrigated, made hemostatic.  The specimen was sent to pathology.  Given its large area, a drain was recommended.  Through a separate stab incision, a 19 Blake drain was placed.  It was secured to the skin with 2-0 nylon.  This was placed into the cavity.  The cavity was closed with 0 Vicryl and 3-0 Monocryl.  Liquid adhesive applied.  All final counts were found to be correct of sponge, needles, and instruments.  The patient was then placed back supine, extubated, and taken to PACU in satisfactory condition.  All final counts found to be correct.     Terald Jump A. Maycen Degregory, M.D.     TAC/MEDQ  D:  12/01/2015  T:  12/01/2015  Job:  [id number]

## 2015-12-01 NOTE — Transfer of Care (Signed)
Immediate Anesthesia Transfer of Care Note  Patient: Curtis Duran  Procedure(s) Performed: Procedure(s): EXCISION OF LIPOMA OF BACK (N/A)  Patient Location: PACU  Anesthesia Type:General  Level of Consciousness: awake, alert  and oriented  Airway & Oxygen Therapy: Patient Spontanous Breathing and Patient connected to face mask oxygen  Post-op Assessment: Report given to RN, Post -op Vital signs reviewed and stable and Patient moving all extremities X 4  Post vital signs: Reviewed and stable  Last Vitals:  Vitals:   12/01/15 0633  BP: (!) 130/58  Pulse: 83  Resp: 20  Temp: 37 C    Last Pain:  Vitals:   12/01/15 0633  TempSrc: Oral         Complications: No apparent anesthesia complications

## 2015-12-01 NOTE — Discharge Instructions (Signed)
GENERAL SURGERY: POST OP INSTRUCTIONS ° °###################################################################### ° °EAT °Gradually transition to a high fiber diet with a fiber supplement over the next few weeks after discharge.  Start with a pureed / full liquid diet (see below) ° °WALK °Walk an hour a day.  Control your pain to do that.   ° °CONTROL PAIN °Control pain so that you can walk, sleep, tolerate sneezing/coughing, go up/down stairs. ° °HAVE A BOWEL MOVEMENT DAILY °Keep your bowels regular to avoid problems.  OK to try a laxative to override constipation.  OK to use an antidairrheal to slow down diarrhea.  Call if not better after 2 tries ° °CALL IF YOU HAVE PROBLEMS/CONCERNS °Call if you are still struggling despite following these instructions. °Call if you have concerns not answered by these instructions ° °###################################################################### ° ° ° °1. DIET: Follow a light bland diet the first 24 hours after arrival home, such as soup, liquids, crackers, etc.  Be sure to include lots of fluids daily.  Avoid fast food or heavy meals as your are more likely to get nauseated.   °2. Take your usually prescribed home medications unless otherwise directed. °3. PAIN CONTROL: °a. Pain is best controlled by a usual combination of three different methods TOGETHER: °i. Ice/Heat °ii. Over the counter pain medication °iii. Prescription pain medication °b. Most patients will experience some swelling and bruising around the incisions.  Ice packs or heating pads (30-60 minutes up to 6 times a day) will help. Use ice for the first few days to help decrease swelling and bruising, then switch to heat to help relax tight/sore spots and speed recovery.  Some people prefer to use ice alone, heat alone, alternating between ice & heat.  Experiment to what works for you.  Swelling and bruising can take several weeks to resolve.   °c. It is helpful to take an over-the-counter pain medication  regularly for the first few weeks.  Choose one of the following that works best for you: °i. Naproxen (Aleve, etc)  Two 220mg tabs twice a day °ii. Ibuprofen (Advil, etc) Three 200mg tabs four times a day (every meal & bedtime) °iii. Acetaminophen (Tylenol, etc) 500-650mg four times a day (every meal & bedtime) °d. A  prescription for pain medication (such as oxycodone, hydrocodone, etc) should be given to you upon discharge.  Take your pain medication as prescribed.  °i. If you are having problems/concerns with the prescription medicine (does not control pain, nausea, vomiting, rash, itching, etc), please call us [phone] to see if we need to switch you to a different pain medicine that will work better for you and/or control your side effect better. °ii. If you need a refill on your pain medication, please contact your pharmacy.  They will contact our office to request authorization. Prescriptions will not be filled after 5 pm or on week-ends. °4. Avoid getting constipated.  Between the surgery and the pain medications, it is common to experience some constipation.  Increasing fluid intake and taking a fiber supplement (such as Metamucil, Citrucel, FiberCon, MiraLax, etc) 1-2 times a day regularly will usually help prevent this problem from occurring.  A mild laxative (prune juice, Milk of Magnesia, MiraLax, etc) should be taken according to package directions if there are no bowel movements after 48 hours.   °5. Wash / shower every day.  You may shower over the dressings as they are waterproof.  Continue to shower over incision(s) after the dressing is off. °6. Remove your waterproof bandages   5 days after surgery.  You may leave the incision open to air.  You may have skin tapes (Steri Strips) covering the incision(s).  Leave them on until one week, then remove.  You may replace a dressing/Band-Aid to cover the incision for comfort if you wish.      7. ACTIVITIES as tolerated:   a. You may resume  regular (light) daily activities beginning the next day--such as daily self-care, walking, climbing stairs--gradually increasing activities as tolerated.  If you can walk 30 minutes without difficulty, it is safe to try more intense activity such as jogging, treadmill, bicycling, low-impact aerobics, swimming, etc. b. Save the most intensive and strenuous activity for last such as sit-ups, heavy lifting, contact sports, etc  Refrain from any heavy lifting or straining until you are off narcotics for pain control.   c. DO NOT PUSH THROUGH PAIN.  Let pain be your guide: If it hurts to do something, don't do it.  Pain is your body warning you to avoid that activity for another week until the pain goes down. d. You may drive when you are no longer taking prescription pain medication, you can comfortably wear a seatbelt, and you can safely maneuver your car and apply brakes. e. Dennis Bast may have sexual intercourse when it is comfortable.  8. FOLLOW UP in our office a. Please call [facility] at [phone] to set up an appointment to see your surgeon in the office for a follow-up appointment approximately 2-3 weeks after your surgery. b. Make sure that you call for this appointment the day you arrive home to insure a convenient appointment time. 9. IF YOU HAVE DISABILITY OR FAMILY LEAVE FORMS, BRING THEM TO THE OFFICE FOR PROCESSING.  DO NOT GIVE THEM TO YOUR DOCTOR.   WHEN TO CALL us [phone]: 1. Poor pain control 2. Reactions / problems with new medications (rash/itching, nausea, etc)  3. Fever over 101.5 F (38.5 C) 4. Worsening swelling or bruising 5. Continued bleeding from incision. 6. Increased pain, redness, or drainage from the incision 7. Difficulty breathing / swallowing   The clinic staff is available to answer your questions during regular business hours (8:30am-5pm).  Please dont hesitate to call and ask to speak to one of our nurses for clinical concerns.   If you have a medical emergency,  go to the nearest emergency room or call 911.  A surgeon from [facility] is always on call at the [facility], [place], [place], [place], [place]  [address] ? MAIN: [phone] ? TOLL FREE: [phone] ?  FAX [phone] [email or web address]      [facility] Introduction Surgical drains are used to remove extra fluid that normally builds up in a surgical wound after surgery. A surgical drain helps to heal a surgical wound. Different kinds of surgical drains include:  Active drains. These drains use suction to pull drainage away from the surgical wound. Drainage flows through a tube to a container outside of the body. It is important to keep the bulb or the drainage container flat (compressed) at all times, except while you empty it. Flattening the bulb or container creates suction. The two most common types of active drains are bulb drains and Hemovac drains.  Passive drains. These drains allow fluid to drain naturally, by gravity. Drainage flows through a tube to a bandage (dressing) or a container outside of the body. Passive drains do not need to be  emptied. The most common type of passive drain is the Penrose drain. A drain is placed during surgery. Immediately after surgery, drainage is usually bright red and a little thicker than water. The drainage may gradually turn yellow or pink and become thinner. It is likely that your health care provider will remove the drain when the drainage stops or when the amount decreases to 1-2 Tbsp (15-30 mL) during a 24-hour period. How to care for your surgical drain  Keep the skin around the drain dry and covered with a dressing at all times.  Check your drain area every day for signs of infection. Check for:  More redness, swelling, or pain.  Pus or a bad smell.  Cloudy drainage. Follow instructions from your health care provider about how to take care of  your drain and how to change your dressing. Change your dressing at least one time every day. Change it more often if needed to keep the dressing dry. Make sure you: 1. Gather your supplies, including:  Tape.  Germ-free cleaning solution (sterile saline).  Split gauze drain sponge: 4 x 4 inches (10 x 10 cm).  Gauze square: 4 x 4 inches (10 x 10 cm). 2. Wash your hands with soap and water before you change your dressing. If soap and water are not available, use hand sanitizer. 3. Remove the old dressing. Avoid using scissors to do that. 4. Use sterile saline to clean your skin around the drain. 5. Place the tube through the slit in a drain sponge. Place the drain sponge so that it covers your wound. 6. Place the gauze square or another drain sponge on top of the drain sponge that is on the wound. Make sure the tube is between those layers. 7. Tape the dressing to your skin. 8. If you have an active bulb or Hemovac drain, tape the drainage tube to your skin 1-2 inches (2.5-5 cm) below the place where the tube enters your body. Taping keeps the tube from pulling on any stitches (sutures) that you have. 9. Wash your hands with soap and water. 10. Write down the color of your drainage and how often you change your dressing. How to empty your active bulb or Hemovac drain 1. Make sure that you have a measuring cup that you can empty your drainage into. 2. Wash your hands with soap and water. If soap and water are not available, use hand sanitizer. 3. Gently move your fingers down the tube while squeezing very lightly. This is called stripping the tube. This clears any drainage, clots, or tissue from the tube.  Do not pull on the tube.  You may need to strip the tube several times every day to keep the tube clear. 4. Open the bulb cap or the drain plug. Do not touch the inside of the cap or the bottom of the plug. 5. Empty all of the drainage into the measuring cup. 6. Compress the bulb or the  container and replace the cap or the plug. To compress the bulb or the container, squeeze it firmly in the middle while you close the cap or plug the container. 7. Write down the amount of drainage that you have in each 24-hour period. If you have less than 2 Tbsp (30 mL) of drainage during 24 hours, contact your health care provider. 8. Flush the drainage down the toilet. 9. Wash your hands with soap and water. Contact a health care provider if:  You have more redness, swelling, or pain  around your drain area.  The amount of drainage that you have is increasing instead of decreasing.  You have pus or a bad smell coming from your drain area.  You have a fever.  You have drainage that is cloudy.  There is a sudden stop or a sudden decrease in the amount of drainage that you have.  Your tube falls out.  Your active draindoes not stay compressedafter you empty it. This information is not intended to replace advice given to you by your health care provider. Make sure you discuss any questions you have with your health care provider. Document Released: 01/01/2000 Document Revised: 06/11/2015 Document Reviewed: 07/23/2014  2017 Elsevier

## 2015-12-01 NOTE — Anesthesia Procedure Notes (Signed)
Procedure Name: Intubation Date/Time: 12/01/2015 7:32 AM Performed by: Neldon Newport Pre-anesthesia Checklist: Timeout performed, Patient being monitored, Suction available, Emergency Drugs available and Patient identified Patient Re-evaluated:Patient Re-evaluated prior to inductionOxygen Delivery Method: Circle system utilized Preoxygenation: Pre-oxygenation with 100% oxygen Intubation Type: IV induction Ventilation: Mask ventilation without difficulty Laryngoscope Size: Mac and 4 Grade View: Grade II Tube type: Oral Tube size: 7.5 mm Number of attempts: 1 Placement Confirmation: breath sounds checked- equal and bilateral,  positive ETCO2 and ETT inserted through vocal cords under direct vision Secured at: 23 cm Tube secured with: Tape Dental Injury: Teeth and Oropharynx as per pre-operative assessment

## 2015-12-01 NOTE — Interval H&P Note (Signed)
History and Physical Interval Note:  12/01/2015 7:16 AM  Curtis Duran  has presented today for surgery, with the diagnosis of LIPOMA OF BACK 11CMX12CM  The various methods of treatment have been discussed with the patient and family. After consideration of risks, benefits and other options for treatment, the patient has consented to  Procedure(s): EXCISION OF LIPOMA OF BACK (N/A) as a surgical intervention .  The patient's history has been reviewed, patient examined, no change in status, stable for surgery.  I have reviewed the patient's chart and labs.  Questions were answered to the patient's satisfaction.     Dakwan Pridgen A.

## 2015-12-01 NOTE — Brief Op Note (Signed)
12/01/2015  8:34 AM  PATIENT:  Curtis Duran [birth year]  60 y.o. male  PRE-OPERATIVE DIAGNOSIS:  LIPOMA OF BACK 11CMX12CM  POST-OPERATIVE DIAGNOSIS:  LIPOMA OF BACK 11CMX12CM  PROCEDURE:  Procedure(s): EXCISION OF LIPOMA OF BACK (N/A)  SURGEON:  Surgeon(s) and Role:    * Erroll Luna, MD - Primary    ASSISTANTS: none   ANESTHESIA:   local and general  EBL:  Total I/O In: 1000 [I.V.:1000] Out: 25 [Blood:25]  BLOOD ADMINISTERED:none  DRAINS: (19) Blake drain(s) in the wound    LOCAL MEDICATIONS USED:  BUPIVICAINE   SPECIMEN:  Source of Specimen:  lipoma   DISPOSITION OF SPECIMEN:  PATHOLOGY  COUNTS:  YES  TOURNIQUET:  * No tourniquets in log *  DICTATION: .Other Dictation: Dictation Number [id number]  PLAN OF CARE: Discharge to home after PACU  PATIENT DISPOSITION:  PACU - hemodynamically stable.   Delay start of Pharmacological VTE agent (>24hrs) due to surgical blood loss or risk of bleeding: not applicable

## 2015-12-01 NOTE — Progress Notes (Signed)
Report given to robin roberts rn as caregiver 

## 2015-12-01 NOTE — Anesthesia Postprocedure Evaluation (Signed)
Anesthesia Post Note  Patient: CORRAN PERSELL  Procedure(s) Performed: Procedure(s) (LRB): EXCISION OF LIPOMA OF BACK (N/A)  Patient location during evaluation: PACU Anesthesia Type: General Level of consciousness: awake and alert, oriented and patient cooperative Pain management: pain level controlled Vital Signs Assessment: post-procedure vital signs reviewed and stable Respiratory status: spontaneous breathing, nonlabored ventilation and respiratory function stable Cardiovascular status: blood pressure returned to baseline and stable Postop Assessment: no signs of nausea or vomiting Anesthetic complications: no    Last Vitals:  Vitals:   12/01/15 0930 12/01/15 0940  BP:  126/80  Pulse: 90 89  Resp: 20 20  Temp:      Last Pain:  Vitals:   12/01/15 0940  TempSrc:   PainSc: 0-No pain                 Estelita Iten,E. Melody Cirrincione

## 2015-12-01 NOTE — H&P (Signed)
Pre-op/Pre-procedure Orders Open     [facility]  Erroll Luna, MD  General Surgery   Additional Documentation   Encounter Info:   Billing Info,   History,   Allergies,   Detailed Report     All Notes   H&P by Erroll Luna, MD    Author: Erroll Luna, MD Author Type: Physician Filed: 1 4:53 PM  Note Status: Signed Cosign: Cosign Not Required Encounter Date:  4:53 PM  Editor: Erroll Luna, MD (Physician)    Salley Hews Location: [facility] Patient #: [id number] DOB: 12/22/1955 Married / Language: English / Race: Black or African American Male  History of Present Illness Marcello Moores A. Ohn Bostic Patient words: Patient returns for follow-up of his large lipoma right upper back. He is ready to schedule surgery due to its size and discomfort.                   CLINICAL DATA: Lipoma on the upper back/ lower neck for 5 years. Interval growth.  EXAM: MRI CHEST WITHOUT AND WITH CONTRAST  TECHNIQUE: Multiplanar and multisequence MRI of the upper chest was performed prior to and following intravenous contrast.  CONTRAST: 19mL MULTIHANCE GADOBENATE DIMEGLUMINE 529 MG/ML IV SOLN  COMPARISON: None.  FINDINGS: There is a 3.7 x 11.7 x 12.6 cm T1 hyperintense mass in the upper back/lower neck, to the right of midline with signal dropout on fat saturated images. There is no abnormal enhancing nodule or thickened septa. There are a few thin internal septations. The appearance is most consistent with a simple lipoma. The lipoma is located within the subcutaneous fat.  The visualized osseous structures demonstrate no focal marrow signal abnormality. The muscles are normal in signal. The muscles enhance normally and homogeneously. There is no focal fluid collection or hematoma.  The visualized cervical spine demonstrates a mild broad-based disc bulge at C3-4. There is mild left foraminal stenosis at C3-4.  The vertebral body heights are maintained and are in normal anatomic alignment.  IMPRESSION: 1. Large simple lipoma of the upper back/lower neck to the right of midline without areas of abnormal enhancement.   Electronically Signed By: Kathreen Devoid On: 08/02/2014 17:18.  The patient is a 60 year old male.   Allergies (Sonya Bynum, CMA;  Citalopram Hydrobromide *ANTIDEPRESSANTS* Rash.  Medication History Marjean Donna, CMA;  Atorvastatin Calcium (20MG  Tablet, Oral) Active. Lumigan (0.01% Solution, Ophthalmic) Active. BuPROPion HCl ER (XL) (150MG  Tablet ER 24HR, Oral) Active. Triamcinolone Acetonide (0.1% Cream, External) Active. Medications Reconciled    Vitals (Sonya Bynum CMA;  10/19/2015 2:29 PM Weight: 261 lb Height: 76in Body Surface Area: 2.48 m Body Mass Index: 31.77 kg/m  Temp.: 97.71F(Temporal)  Pulse: 82 (Regular)  BP: 126/80 (Sitting, Left Arm, Standard)      Physical Exam (Angelena Sand A. Lillieann Pavlich MD;   General Mental Status-Alert. General Appearance-Consistent with stated age. Hydration-Well hydrated. Voice-Normal.  Integumentary Note: Transabdominal lipoma over his right shoulder to the base of his neck region.  Chest and Lung Exam Chest and lung exam reveals -quiet, even and easy respiratory effort with no use of accessory muscles and on auscultation, normal breath sounds, no adventitious sounds and normal vocal resonance. Inspection Chest Wall - Normal. Back - normal.  Musculoskeletal Normal Exam - Left-Upper Extremity Strength Normal and Lower Extremity Strength Normal. Normal Exam - Right-Upper Extremity Strength Normal, Lower Extremity Weakness.    Assessment & Plan (Travius Crochet A. Destenee Guerry MD;   LIPOMA OF BACK (D17.1) Impression: MRI shows lipoma no complicating features  ready to schedule surgery     Risk of bleeding, infection, nerve injury, blood vessel injury, recurrence,  functional limitations after surgery, the use of a drain, and potential other operations, treatments and/or procedures discussed with patient. The complications of blood clots, heart attack, stroke, and the need for other medical treatments discussed. He agrees to proceed.  Current Plans The pathophysiology of skin & subcutaneous masses was discussed. Natural history risks without surgery were discussed. I recommended surgery to remove the mass. I explained the technique of removal with use of local anesthesia & possible need for more aggressive sedation/anesthesia for patient comfort.  Risks such as bleeding, infection, wound breakdown, heart attack, death, and other risks were discussed. I noted a good likelihood this will help address the problem. Possibility that this will not correct all symptoms was explained. Possibility of regrowth/recurrence of the mass was discussed. We will work to minimize complications. Questions were answered. The patient expresses understanding & wishes to proceed with surgery.  Pt Education - [facility] Free Text Education/Instructions: discussed with patient and provided information.

## 2015-12-02 ENCOUNTER — Encounter (HOSPITAL_COMMUNITY): Payer: Self-pay | Admitting: Surgery

## 2015-12-25 ENCOUNTER — Other Ambulatory Visit: Payer: Self-pay | Admitting: Family Medicine

## 2016-02-29 DIAGNOSIS — J019 Acute sinusitis, unspecified: Secondary | ICD-10-CM | POA: Diagnosis not present

## 2016-02-29 DIAGNOSIS — R05 Cough: Secondary | ICD-10-CM | POA: Diagnosis not present

## 2016-03-11 ENCOUNTER — Encounter: Payer: Self-pay | Admitting: Family Medicine

## 2016-03-11 ENCOUNTER — Ambulatory Visit (INDEPENDENT_AMBULATORY_CARE_PROVIDER_SITE_OTHER): Payer: 59 | Admitting: Family Medicine

## 2016-03-11 VITALS — BP 158/74 | Temp 98.7°F | Ht 76.0 in | Wt 267.8 lb

## 2016-03-11 DIAGNOSIS — J209 Acute bronchitis, unspecified: Secondary | ICD-10-CM

## 2016-03-11 MED ORDER — CLARITHROMYCIN 500 MG PO TABS: 500.0000 mg | ORAL_TABLET | Freq: Two times a day (BID) | ORAL | 0 refills | Status: DC

## 2016-03-11 NOTE — Progress Notes (Signed)
   Subjective:    Patient ID: Curtis Duran, male    DOB: 14-Sep-1955, 61 y.o.   MRN: [id number]  HPI Here to follow up an urgent care visit on 02-29-16 for fever, chest tightness, wheezing, and a dry cough. He has now been sick for a total of 2 weeks. At the [facility] office he was given a prednisone taper pack and Amoxicillin. He is no better now, but the fever is gone.    Review of Systems  Constitutional: Negative.   HENT: Positive for congestion and postnasal drip. Negative for sinus pain, sinus pressure and sore throat.   Eyes: Negative.   Respiratory: Positive for cough, chest tightness, shortness of breath and wheezing.   Cardiovascular: Negative.        Objective:   Physical Exam  Constitutional: He appears well-developed and well-nourished. No distress.  HENT:  Right Ear: External ear normal.  Left Ear: External ear normal.  Nose: Nose normal.  Mouth/Throat: Oropharynx is clear and moist.  Eyes: Conjunctivae are normal.  Neck: No JVD present.  Pulmonary/Chest: Effort normal. No respiratory distress. He has no wheezes. He has no rales.  Scattered rhonchi   Lymphadenopathy:    He has no cervical adenopathy.          Assessment & Plan:  Partially treated bronchitis, possibly atypical. Treat with Biaxin.  Alysia Penna, MD

## 2016-03-21 DIAGNOSIS — H401131 Primary open-angle glaucoma, bilateral, mild stage: Secondary | ICD-10-CM | POA: Diagnosis not present

## 2016-03-21 DIAGNOSIS — H524 Presbyopia: Secondary | ICD-10-CM | POA: Diagnosis not present

## 2016-03-24 ENCOUNTER — Other Ambulatory Visit: Payer: Self-pay | Admitting: Family Medicine

## 2016-03-24 ENCOUNTER — Other Ambulatory Visit (INDEPENDENT_AMBULATORY_CARE_PROVIDER_SITE_OTHER): Payer: 59

## 2016-03-24 DIAGNOSIS — Z0000 Encounter for general adult medical examination without abnormal findings: Secondary | ICD-10-CM

## 2016-03-24 LAB — CBC WITH DIFFERENTIAL/PLATELET
Basophils Absolute: 0 10*3/uL (ref 0.0–0.1)
Basophils Relative: 0.5 % (ref 0.0–3.0)
EOS ABS: 0.5 10*3/uL (ref 0.0–0.7)
Eosinophils Relative: 6.3 % — ABNORMAL HIGH (ref 0.0–5.0)
HCT: 45.1 % (ref 39.0–52.0)
Hemoglobin: 15 g/dL (ref 13.0–17.0)
LYMPHS ABS: 2.8 10*3/uL (ref 0.7–4.0)
Lymphocytes Relative: 38.8 % (ref 12.0–46.0)
MCHC: 33.3 g/dL (ref 30.0–36.0)
MCV: 91.9 fl (ref 78.0–100.0)
MONO ABS: 0.7 10*3/uL (ref 0.1–1.0)
Monocytes Relative: 10.3 % (ref 3.0–12.0)
NEUTROS ABS: 3.1 10*3/uL (ref 1.4–7.7)
NEUTROS PCT: 44.1 % (ref 43.0–77.0)
PLATELETS: 212 10*3/uL (ref 150.0–400.0)
RBC: 4.9 Mil/uL (ref 4.22–5.81)
RDW: 14.2 % (ref 11.5–15.5)
WBC: 7.1 10*3/uL (ref 4.0–10.5)

## 2016-03-24 LAB — HEPATIC FUNCTION PANEL
ALBUMIN: 4.1 g/dL (ref 3.5–5.2)
ALT: 33 U/L (ref 0–53)
AST: 19 U/L (ref 0–37)
Alkaline Phosphatase: 57 U/L (ref 39–117)
BILIRUBIN TOTAL: 0.4 mg/dL (ref 0.2–1.2)
Bilirubin, Direct: 0.1 mg/dL (ref 0.0–0.3)
Total Protein: 6.4 g/dL (ref 6.0–8.3)

## 2016-03-24 LAB — BASIC METABOLIC PANEL
BUN: 11 mg/dL (ref 6–23)
CALCIUM: 9.6 mg/dL (ref 8.4–10.5)
CO2: 29 meq/L (ref 19–32)
CREATININE: 1.13 mg/dL (ref 0.40–1.50)
Chloride: 104 mEq/L (ref 96–112)
GFR: 84.52 mL/min (ref >60.00)
GLUCOSE: 134 mg/dL — AB (ref 70–99)
Potassium: 4 mEq/L (ref 3.5–5.1)
Sodium: 141 mEq/L (ref 135–145)

## 2016-03-24 LAB — LIPID PANEL
CHOLESTEROL: 162 mg/dL (ref 0–200)
HDL: 62.3 mg/dL (ref >39.00)
LDL Cholesterol: 77 mg/dL (ref 0–99)
NonHDL: 100.17
Total CHOL/HDL Ratio: 3
Triglycerides: 116 mg/dL (ref 0.0–149.0)
VLDL: 23.2 mg/dL (ref 0.0–40.0)

## 2016-03-24 LAB — TSH: TSH: 2.5 u[IU]/mL (ref 0.35–4.50)

## 2016-03-24 LAB — PSA: PSA: 6.49 ng/mL — AB (ref 0.10–4.00)

## 2016-03-24 LAB — HEMOGLOBIN A1C: Hgb A1c MFr Bld: 7.6 % — ABNORMAL HIGH (ref 4.6–6.5)

## 2016-03-25 LAB — POC URINALSYSI DIPSTICK (AUTOMATED)
BILIRUBIN UA: NEGATIVE
GLUCOSE UA: NEGATIVE
Ketones, UA: NEGATIVE
Leukocytes, UA: NEGATIVE
NITRITE UA: NEGATIVE
Protein, UA: NEGATIVE
SPEC GRAV UA: 1.025
UROBILINOGEN UA: 0.2
pH, UA: 6.5

## 2016-03-25 LAB — MICROALBUMIN / CREATININE URINE RATIO
CREATININE, U: 138.2 mg/dL
Microalb Creat Ratio: 0.5 mg/g (ref 0.0–30.0)

## 2016-03-31 ENCOUNTER — Ambulatory Visit (INDEPENDENT_AMBULATORY_CARE_PROVIDER_SITE_OTHER): Payer: 59 | Admitting: Family Medicine

## 2016-03-31 ENCOUNTER — Encounter: Payer: Self-pay | Admitting: Family Medicine

## 2016-03-31 VITALS — BP 128/80 | Temp 98.4°F | Ht 76.0 in | Wt 275.0 lb

## 2016-03-31 DIAGNOSIS — E119 Type 2 diabetes mellitus without complications: Secondary | ICD-10-CM

## 2016-03-31 DIAGNOSIS — R972 Elevated prostate specific antigen [PSA]: Secondary | ICD-10-CM

## 2016-03-31 DIAGNOSIS — Z0000 Encounter for general adult medical examination without abnormal findings: Secondary | ICD-10-CM

## 2016-03-31 DIAGNOSIS — F321 Major depressive disorder, single episode, moderate: Secondary | ICD-10-CM

## 2016-03-31 DIAGNOSIS — Z0001 Encounter for general adult medical examination with abnormal findings: Secondary | ICD-10-CM | POA: Diagnosis not present

## 2016-03-31 DIAGNOSIS — K219 Gastro-esophageal reflux disease without esophagitis: Secondary | ICD-10-CM | POA: Diagnosis not present

## 2016-03-31 MED ORDER — ROSUVASTATIN CALCIUM 10 MG PO TABS: 10.0000 mg | ORAL_TABLET | Freq: Every day | ORAL | 3 refills | Status: DC

## 2016-03-31 MED ORDER — METFORMIN HCL 500 MG PO TABS: 500.0000 mg | ORAL_TABLET | Freq: Two times a day (BID) | ORAL | 3 refills | Status: DC

## 2016-03-31 MED ORDER — BUPROPION HCL ER (XL) 300 MG PO TB24: 300.0000 mg | ORAL_TABLET | Freq: Every day | ORAL | 3 refills | Status: DC

## 2016-03-31 MED ORDER — OMEPRAZOLE 40 MG PO CPDR: 40.0000 mg | DELAYED_RELEASE_CAPSULE | Freq: Every day | ORAL | 3 refills | Status: DC

## 2016-03-31 NOTE — Progress Notes (Signed)
Pre visit review using our clinic review tool, if applicable. No additional management support is needed unless otherwise documented below in the visit note. 

## 2016-03-31 NOTE — Progress Notes (Signed)
   Subjective:    Patient ID: Curtis Duran, male    DOB: 12-Jul-1955, 61 y.o.   MRN: [id number]  HPI 61 yr old male for a well exam, but he also has some issues to discuss. First he has felt moire depressed lately and asks if his dose of Wellbutrin can be increased. He feels sad a lot and gets irritable. Sleep and appetite are preserved. He mentions frequent heartburn and takes Omeprazole OTC at times with good results. As we discussed his diabetes, he mentions that he has a strong family hx of diabetes. Also he mentions his 3 yr old brother was just diagnosed with prostate cancer.    Review of Systems  Constitutional: Negative.   HENT: Negative.   Eyes: Negative.   Respiratory: Negative.   Cardiovascular: Negative.   Gastrointestinal: Negative.   Genitourinary: Negative.   Musculoskeletal: Negative.   Skin: Negative.   Neurological: Negative.   Psychiatric/Behavioral: Positive for decreased concentration and dysphoric mood. Negative for agitation, behavioral problems, confusion, hallucinations, self-injury, sleep disturbance and suicidal ideas. The patient is not nervous/anxious and is not hyperactive.        Objective:   Physical Exam  Constitutional: He is oriented to person, place, and time. He appears well-developed and well-nourished. No distress.  HENT:  Head: Normocephalic and atraumatic.  Right Ear: External ear normal.  Left Ear: External ear normal.  Nose: Nose normal.  Mouth/Throat: Oropharynx is clear and moist. No oropharyngeal exudate.  Eyes: Conjunctivae and EOM are normal. Pupils are equal, round, and reactive to light. Right eye exhibits no discharge. Left eye exhibits no discharge. No scleral icterus.  Neck: Neck supple. No JVD present. No tracheal deviation present. No thyromegaly present.  Cardiovascular: Normal rate, regular rhythm, normal heart sounds and intact distal pulses.  Exam reveals no gallop and no friction rub.   No murmur heard. Pulmonary/Chest:  Effort normal and breath sounds normal. No respiratory distress. He has no wheezes. He has no rales. He exhibits no tenderness.  Abdominal: Soft. Bowel sounds are normal. He exhibits no distension and no mass. There is no tenderness. There is no rebound and no guarding.  Genitourinary: Rectum normal, prostate normal and penis normal. Rectal exam shows guaiac negative stool. No penile tenderness.  Musculoskeletal: Normal range of motion. He exhibits no edema or tenderness.  Lymphadenopathy:    He has no cervical adenopathy.  Neurological: He is alert and oriented to person, place, and time. He has normal reflexes. No cranial nerve deficit. He exhibits normal muscle tone. Coordination normal.  Skin: Skin is warm and dry. No rash noted. He is not diaphoretic. No erythema. No pallor.  Psychiatric: He has a normal mood and affect. His behavior is normal. Judgment and thought content normal.          Assessment & Plan:  Well exam. We discussed diet and exercise. His diabetes has gotten a little worse so he will start taking Metformin 500 mg bid and we will refer him to Nutrition. Recheck in one month. For the GERD he will start taking Omeprazole 40 mg daily. For the elevated PSA, we will refer him to Urology. For the depression we will increase the Wellbutrin XL to 300 mg daily.  Alysia Penna, MD u

## 2016-03-31 NOTE — Patient Instructions (Signed)
WE NOW OFFER   [facility] FAST TRACK!!!  SAME DAY Appointments for ACUTE CARE  Such as: Sprains, Injuries, cuts, abrasions, rashes, muscle pain, joint pain, back pain Colds, flu, sore throats, headache, allergies, cough, fever  Ear pain, sinus and eye infections Abdominal pain, nausea, vomiting, diarrhea, upset stomach Animal/insect bites  3 Easy Ways to Schedule: Walk-In Scheduling Call in scheduling Mychart Sign-up: [email or web address]         

## 2016-04-09 ENCOUNTER — Other Ambulatory Visit: Payer: Self-pay | Admitting: Family Medicine

## 2016-04-19 ENCOUNTER — Ambulatory Visit: Payer: 59

## 2016-04-21 ENCOUNTER — Encounter: Payer: Self-pay | Admitting: Dietician

## 2016-04-21 ENCOUNTER — Encounter: Payer: 59 | Attending: Family Medicine | Admitting: Dietician

## 2016-04-21 DIAGNOSIS — E119 Type 2 diabetes mellitus without complications: Secondary | ICD-10-CM | POA: Diagnosis not present

## 2016-04-21 DIAGNOSIS — Z713 Dietary counseling and surveillance: Secondary | ICD-10-CM | POA: Diagnosis not present

## 2016-04-21 NOTE — Progress Notes (Signed)
Patient was seen on 04/21/16 for the first of a series of three diabetes self-management courses at [facility].  Patient Education Plan per assessed needs and concerns is to attend four course education program for Diabetes Self Management Education.  The following learning objectives were met by the patient during this class:  Describe diabetes  State some common risk factors for diabetes  Defines the role of glucose and insulin  Identifies type of diabetes and pathophysiology  Describe the relationship between diabetes and cardiovascular risk  State the members of the Healthcare Team  States the rationale for glucose monitoring  State when to test glucose  State their individual Target Range  State the importance of logging glucose readings  Describe how to interpret glucose readings  Identifies A1C target  Explain the correlation between A1c and eAG values  State symptoms and treatment of high blood glucose  State symptoms and treatment of low blood glucose  Explain proper technique for glucose testing  Identifies proper sharps disposal  Handouts given during class include:  Living Well with Diabetes book  Carb Counting and Meal Planning book  Meal Plan Card  Carbohydrate guide  Meal planning worksheet  Low Sodium Flavoring Tips  The diabetes portion plate  B4Z to eAG Conversion Chart  Diabetes Medications  Diabetes Recommended Care Schedule  Support Group  Diabetes Success Plan  Core Class Satisfaction Survey  Follow-Up Plan:  Attend core 2

## 2016-04-26 ENCOUNTER — Ambulatory Visit: Payer: 59

## 2016-04-28 ENCOUNTER — Encounter: Payer: 59 | Admitting: Dietician

## 2016-04-28 DIAGNOSIS — Z713 Dietary counseling and surveillance: Secondary | ICD-10-CM | POA: Diagnosis not present

## 2016-04-28 DIAGNOSIS — E119 Type 2 diabetes mellitus without complications: Secondary | ICD-10-CM

## 2016-04-28 NOTE — Progress Notes (Signed)

## 2016-05-02 ENCOUNTER — Encounter: Payer: Self-pay | Admitting: Family Medicine

## 2016-05-02 ENCOUNTER — Ambulatory Visit (INDEPENDENT_AMBULATORY_CARE_PROVIDER_SITE_OTHER): Payer: 59 | Admitting: Family Medicine

## 2016-05-02 VITALS — BP 118/80 | Temp 99.0°F | Ht 76.0 in | Wt 272.0 lb

## 2016-05-02 DIAGNOSIS — E119 Type 2 diabetes mellitus without complications: Secondary | ICD-10-CM | POA: Diagnosis not present

## 2016-05-02 DIAGNOSIS — K219 Gastro-esophageal reflux disease without esophagitis: Secondary | ICD-10-CM

## 2016-05-02 DIAGNOSIS — F321 Major depressive disorder, single episode, moderate: Secondary | ICD-10-CM

## 2016-05-02 NOTE — Progress Notes (Signed)
Pre visit review using our clinic review tool, if applicable. No additional management support is needed unless otherwise documented below in the visit note. 

## 2016-05-02 NOTE — Patient Instructions (Signed)
WE NOW OFFER   [place] [place] [facility] FAST TRACK!!!  SAME DAY Appointments for ACUTE CARE  Such as: Sprains, Injuries, cuts, abrasions, rashes, muscle pain, joint pain, back pain Colds, flu, sore throats, headache, allergies, cough, fever  Ear pain, sinus and eye infections Abdominal pain, nausea, vomiting, diarrhea, upset stomach Animal/insect bites  3 Easy Ways to Schedule: Walk-In Scheduling Call in scheduling Mychart Sign-up: [email or web address]         

## 2016-05-02 NOTE — Progress Notes (Signed)
   Subjective:    Patient ID: Curtis Duran, male    DOB: 31-Aug-1955, 61 y.o.   MRN: [id number]  HPI Here to follow up. In general he is doing much better. We increased the dose of Wellbutrin and his moods have improved greatly. His GERD is controlled on Omeprazole. He started on Metformin and he saw the Nurtritionist for the diabetes. He has lost 3 lbs since last month.    Review of Systems  Constitutional: Negative.   Respiratory: Negative.   Cardiovascular: Negative.   Endocrine: Negative.   Neurological: Negative.        Objective:   Physical Exam  Constitutional: He is oriented to person, place, and time. He appears well-developed and well-nourished.  Cardiovascular: Normal rate, regular rhythm, normal heart sounds and intact distal pulses.   Pulmonary/Chest: Effort normal and breath sounds normal.  Neurological: He is alert and oriented to person, place, and time.          Assessment & Plan:  He has improved as far as DM, depression , and GERD go. We will check another A1c in 2 months. Alysia Penna, MD

## 2016-05-03 ENCOUNTER — Ambulatory Visit: Payer: 59

## 2016-05-05 ENCOUNTER — Encounter: Payer: 59 | Admitting: Dietician

## 2016-05-05 DIAGNOSIS — E119 Type 2 diabetes mellitus without complications: Secondary | ICD-10-CM

## 2016-05-05 DIAGNOSIS — Z713 Dietary counseling and surveillance: Secondary | ICD-10-CM | POA: Diagnosis not present

## 2016-05-05 NOTE — Progress Notes (Signed)
Patient was seen on 05/05/16 for the third of a series of three diabetes self-management courses at the [facility].   Catalina [place] the amount of activity recommended for healthy living . Describe activities suitable for individual needs . Identify ways to regularly incorporate activity into daily life . Identify barriers to activity and ways to over come these barriers  Identify diabetes medications being personally used and their primary action for lowering glucose and possible side effects . Describe role of stress on blood glucose and develop strategies to address psychosocial issues . Identify diabetes complications and ways to prevent them  Explain how to manage diabetes during illness . Evaluate success in meeting personal goal . Establish 2-3 goals that they will plan to diligently work on until they return for the  67-month follow-up visit  Goals:   I will count my carb choices at most meals and snacks  I will be active 30 minutes or more 10 times a week  I will take my diabetes medications as scheduled  To help manage stress I will  exercise at least 10 times a week  Your patient has identified these potential barriers to change:  Motivation  Your patient has identified their diabetes self-care support plan as  Family Support  Plan:  Attend Support Group as desired

## 2016-05-25 DIAGNOSIS — N400 Enlarged prostate without lower urinary tract symptoms: Secondary | ICD-10-CM | POA: Diagnosis not present

## 2016-05-25 DIAGNOSIS — N500 Atrophy of testis: Secondary | ICD-10-CM | POA: Diagnosis not present

## 2016-05-25 DIAGNOSIS — R972 Elevated prostate specific antigen [PSA]: Secondary | ICD-10-CM | POA: Diagnosis not present

## 2016-06-27 DIAGNOSIS — R972 Elevated prostate specific antigen [PSA]: Secondary | ICD-10-CM | POA: Diagnosis not present

## 2016-06-27 DIAGNOSIS — N500 Atrophy of testis: Secondary | ICD-10-CM | POA: Diagnosis not present

## 2016-07-26 DIAGNOSIS — H401131 Primary open-angle glaucoma, bilateral, mild stage: Secondary | ICD-10-CM | POA: Diagnosis not present

## 2016-09-15 ENCOUNTER — Encounter: Payer: Self-pay | Admitting: Family Medicine

## 2016-09-15 ENCOUNTER — Ambulatory Visit (INDEPENDENT_AMBULATORY_CARE_PROVIDER_SITE_OTHER): Payer: 59 | Admitting: Family Medicine

## 2016-09-15 VITALS — BP 150/90 | HR 105 | Temp 98.9°F | Ht 76.0 in | Wt 271.0 lb

## 2016-09-15 DIAGNOSIS — J209 Acute bronchitis, unspecified: Secondary | ICD-10-CM | POA: Diagnosis not present

## 2016-09-15 MED ORDER — CLARITHROMYCIN 500 MG PO TABS: 500.0000 mg | ORAL_TABLET | Freq: Two times a day (BID) | ORAL | 0 refills | Status: DC

## 2016-09-15 NOTE — Patient Instructions (Signed)
WE NOW OFFER   [place] [facility] FAST TRACK!!!  SAME DAY Appointments for ACUTE CARE  Such as: Sprains, Injuries, cuts, abrasions, rashes, muscle pain, joint pain, back pain Colds, flu, sore throats, headache, allergies, cough, fever  Ear pain, sinus and eye infections Abdominal pain, nausea, vomiting, diarrhea, upset stomach Animal/insect bites  3 Easy Ways to Schedule: Walk-In Scheduling Call in scheduling Mychart Sign-up: [email or web address]         

## 2016-09-15 NOTE — Progress Notes (Signed)
   Subjective:    Patient ID: Curtis Duran, male    DOB: 1955/01/20, 61 y.o.   MRN: [id number]  HPI Here for 2 weeks of chest congestion, wheezing, and a dry cough. No fever. Using Mucinex.    Review of Systems  Constitutional: Negative.   HENT: Negative.   Eyes: Negative.   Respiratory: Positive for cough, chest tightness and wheezing. Negative for shortness of breath.   Cardiovascular: Negative.        Objective:   Physical Exam  Constitutional: He appears well-developed and well-nourished.  HENT:  Right Ear: External ear normal.  Left Ear: External ear normal.  Nose: Nose normal.  Mouth/Throat: Oropharynx is clear and moist.  Eyes: Conjunctivae are normal.  Neck: No thyromegaly present.  Pulmonary/Chest: Effort normal. No respiratory distress. He has no wheezes. He has no rales.  Scattered rhonchi   Lymphadenopathy:    He has no cervical adenopathy.          Assessment & Plan:  Bronchitis, treat with Biaxin.  Alysia Penna, MD

## 2016-10-14 ENCOUNTER — Ambulatory Visit (INDEPENDENT_AMBULATORY_CARE_PROVIDER_SITE_OTHER): Payer: 59

## 2016-10-14 DIAGNOSIS — Z23 Encounter for immunization: Secondary | ICD-10-CM

## 2016-12-02 DIAGNOSIS — H401131 Primary open-angle glaucoma, bilateral, mild stage: Secondary | ICD-10-CM | POA: Diagnosis not present

## 2017-02-16 ENCOUNTER — Encounter: Payer: Self-pay | Admitting: Family Medicine

## 2017-02-16 ENCOUNTER — Ambulatory Visit (INDEPENDENT_AMBULATORY_CARE_PROVIDER_SITE_OTHER): Payer: 59 | Admitting: Family Medicine

## 2017-02-16 ENCOUNTER — Ambulatory Visit (INDEPENDENT_AMBULATORY_CARE_PROVIDER_SITE_OTHER)
Admission: RE | Admit: 2017-02-16 | Discharge: 2017-02-16 | Disposition: A | Discharge status: Home / Self Care | Payer: 59 | Source: Ambulatory Visit | Attending: Family Medicine | Admitting: Family Medicine

## 2017-02-16 VITALS — BP 140/88 | HR 102 | Temp 98.2°F | Ht 75.0 in | Wt 279.0 lb

## 2017-02-16 DIAGNOSIS — Z0000 Encounter for general adult medical examination without abnormal findings: Secondary | ICD-10-CM

## 2017-02-16 DIAGNOSIS — R059 Cough, unspecified: Secondary | ICD-10-CM

## 2017-02-16 DIAGNOSIS — R05 Cough: Secondary | ICD-10-CM

## 2017-02-16 LAB — CBC WITH DIFFERENTIAL/PLATELET
Basophils Absolute: 0.1 10*3/uL (ref 0.0–0.1)
Basophils Relative: 0.7 % (ref 0.0–3.0)
Eosinophils Absolute: 0.3 10*3/uL (ref 0.0–0.7)
Eosinophils Relative: 4 % (ref 0.0–5.0)
HCT: 46.5 % (ref 39.0–52.0)
HEMOGLOBIN: 15.8 g/dL (ref 13.0–17.0)
Lymphocytes Relative: 29 % (ref 12.0–46.0)
Lymphs Abs: 2.2 10*3/uL (ref 0.7–4.0)
MCHC: 34 g/dL (ref 30.0–36.0)
MCV: 90.2 fl (ref 78.0–100.0)
Monocytes Absolute: 0.8 10*3/uL (ref 0.1–1.0)
Monocytes Relative: 11.1 % (ref 3.0–12.0)
Neutro Abs: 4.2 10*3/uL (ref 1.4–7.7)
Neutrophils Relative %: 55.2 % (ref 43.0–77.0)
Platelets: 268 10*3/uL (ref 150.0–400.0)
RBC: 5.15 Mil/uL (ref 4.22–5.81)
RDW: 14.4 % (ref 11.5–15.5)
WBC: 7.6 10*3/uL (ref 4.0–10.5)

## 2017-02-16 LAB — HEPATIC FUNCTION PANEL
ALT: 37 U/L (ref 0–53)
AST: 22 U/L (ref 0–37)
Albumin: 4.5 g/dL (ref 3.5–5.2)
Alkaline Phosphatase: 44 U/L (ref 39–117)
Bilirubin, Direct: 0.1 mg/dL (ref 0.0–0.3)
Total Bilirubin: 0.5 mg/dL (ref 0.2–1.2)
Total Protein: 7.1 g/dL (ref 6.0–8.3)

## 2017-02-16 LAB — LIPID PANEL
CHOL/HDL RATIO: 2
Cholesterol: 136 mg/dL (ref 0–200)
HDL: 54.5 mg/dL (ref >39.00)
LDL CALC: 59 mg/dL (ref 0–99)
NONHDL: 81.12
Triglycerides: 112 mg/dL (ref 0.0–149.0)
VLDL: 22.4 mg/dL (ref 0.0–40.0)

## 2017-02-16 LAB — BASIC METABOLIC PANEL WITH GFR
BUN: 13 mg/dL (ref 6–23)
CO2: 29 meq/L (ref 19–32)
Calcium: 9.6 mg/dL (ref 8.4–10.5)
Chloride: 102 meq/L (ref 96–112)
Creatinine, Ser: 1.14 mg/dL (ref 0.40–1.50)
GFR: 83.42 mL/min (ref >60.00)
Glucose, Bld: 102 mg/dL — ABNORMAL HIGH (ref 70–99)
Potassium: 4.3 meq/L (ref 3.5–5.1)
Sodium: 139 meq/L (ref 135–145)

## 2017-02-16 LAB — POC URINALSYSI DIPSTICK (AUTOMATED)
Bilirubin, UA: NEGATIVE
Glucose, UA: NEGATIVE
Ketones, UA: NEGATIVE
LEUKOCYTES UA: NEGATIVE
NITRITE UA: NEGATIVE
PROTEIN UA: NEGATIVE
SPEC GRAV UA: 1.015 (ref 1.010–1.025)
UROBILINOGEN UA: 0.2 U/dL
pH, UA: 6 (ref 5.0–8.0)

## 2017-02-16 LAB — PSA: PSA: 2.82 ng/mL (ref 0.10–4.00)

## 2017-02-16 LAB — TSH: TSH: 1.88 u[IU]/mL (ref 0.35–4.50)

## 2017-02-16 MED ORDER — OMEPRAZOLE 40 MG PO CPDR: 40.0000 mg | DELAYED_RELEASE_CAPSULE | Freq: Two times a day (BID) | ORAL | 3 refills | Status: DC

## 2017-02-16 NOTE — Progress Notes (Signed)
   Subjective:    Patient ID: Curtis Duran, male    DOB: 20-Nov-1955, 62 y.o.   MRN: [id number]  HPI Here for a well exam and also to discuss a cough that started 4 weeks ago. He never had a cold and never felt sick. The cough is in the center of the chest and is non-productive. No PND or ST. He takes Omeprazole every morning.    Review of Systems  Constitutional: Negative.   HENT: Negative.   Eyes: Negative.   Respiratory: Positive for cough. Negative for apnea, choking, chest tightness, shortness of breath, wheezing and stridor.   Cardiovascular: Negative.   Gastrointestinal: Negative.   Genitourinary: Negative.   Musculoskeletal: Negative.   Skin: Negative.   Neurological: Negative.   Psychiatric/Behavioral: Negative.        Objective:   Physical Exam  Constitutional: He is oriented to person, place, and time. He appears well-developed and well-nourished. No distress.  HENT:  Head: Normocephalic and atraumatic.  Right Ear: External ear normal.  Left Ear: External ear normal.  Nose: Nose normal.  Mouth/Throat: Oropharynx is clear and moist. No oropharyngeal exudate.  Eyes: Conjunctivae and EOM are normal. Pupils are equal, round, and reactive to light. Right eye exhibits no discharge. Left eye exhibits no discharge. No scleral icterus.  Neck: Neck supple. No JVD present. No tracheal deviation present. No thyromegaly present.  Cardiovascular: Normal rate, regular rhythm, normal heart sounds and intact distal pulses. Exam reveals no gallop and no friction rub.  No murmur heard. Pulmonary/Chest: Effort normal and breath sounds normal. No respiratory distress. He has no wheezes. He has no rales. He exhibits no tenderness.  Abdominal: Soft. Bowel sounds are normal. He exhibits no distension and no mass. There is no tenderness. There is no rebound and no guarding.  Genitourinary: Rectum normal, prostate normal and penis normal. Rectal exam shows guaiac negative stool. No penile  tenderness.  Musculoskeletal: Normal range of motion. He exhibits no edema or tenderness.  Lymphadenopathy:    He has no cervical adenopathy.  Neurological: He is alert and oriented to person, place, and time. He has normal reflexes. No cranial nerve deficit. He exhibits normal muscle tone. Coordination normal.  Skin: Skin is warm and dry. No rash noted. He is not diaphoretic. No erythema. No pallor.  Psychiatric: He has a normal mood and affect. His behavior is normal. Judgment and thought content normal.          Assessment & Plan:  Well exam. We discussed diet and exercise. Get fasting labs. His cough may be related to night time reflux. We will increase the Omeprazole to BID. Set up a CXR soon. Alysia Penna, MD

## 2017-03-19 ENCOUNTER — Other Ambulatory Visit: Payer: Self-pay | Admitting: Family Medicine

## 2017-03-20 NOTE — Telephone Encounter (Signed)
Last OV 02/16/2017

## 2017-03-21 DIAGNOSIS — R42 Dizziness and giddiness: Secondary | ICD-10-CM | POA: Diagnosis not present

## 2017-04-03 DIAGNOSIS — H401132 Primary open-angle glaucoma, bilateral, moderate stage: Secondary | ICD-10-CM | POA: Diagnosis not present

## 2017-04-03 DIAGNOSIS — H524 Presbyopia: Secondary | ICD-10-CM | POA: Diagnosis not present

## 2017-05-17 ENCOUNTER — Encounter: Payer: Self-pay | Admitting: Family Medicine

## 2017-05-17 ENCOUNTER — Ambulatory Visit: Payer: 59 | Admitting: Family Medicine

## 2017-05-17 VITALS — BP 124/90 | HR 102 | Temp 98.9°F | Ht 75.0 in | Wt 271.2 lb

## 2017-05-17 DIAGNOSIS — G4730 Sleep apnea, unspecified: Secondary | ICD-10-CM

## 2017-05-17 DIAGNOSIS — J209 Acute bronchitis, unspecified: Secondary | ICD-10-CM

## 2017-05-17 MED ORDER — ALBUTEROL SULFATE HFA 108 (90 BASE) MCG/ACT IN AERS: 2.0000 | INHALATION_SPRAY | RESPIRATORY_TRACT | 0 refills | Status: DC | PRN

## 2017-05-17 MED ORDER — CLARITHROMYCIN 500 MG PO TABS: 500.0000 mg | ORAL_TABLET | Freq: Two times a day (BID) | ORAL | 0 refills | Status: DC

## 2017-05-17 MED ORDER — HYDROCODONE-HOMATROPINE 5-1.5 MG/5ML PO SYRP: 5.0000 mL | ORAL_SOLUTION | ORAL | 0 refills | Status: DC | PRN

## 2017-05-17 NOTE — Progress Notes (Signed)
   Subjective:    Patient ID: Curtis Duran, male    DOB: 17-Feb-1955, 62 y.o.   MRN: [id number]  HPI Here for one week of chest tightness, wheezing and coughing up white sputum. No fever.    Review of Systems  Constitutional: Negative.   HENT: Negative.   Eyes: Negative.   Respiratory: Positive for cough, chest tightness and wheezing. Negative for shortness of breath.        Objective:   Physical Exam  Constitutional: He appears well-developed and well-nourished.  HENT:  Right Ear: External ear normal.  Left Ear: External ear normal.  Nose: Nose normal.  Mouth/Throat: Oropharynx is clear and moist.  Eyes: Conjunctivae are normal.  Neck: No thyromegaly present.  Pulmonary/Chest: Effort normal. No stridor. No respiratory distress. He has no rales.  Scattered wheezes and rhonchi   Lymphadenopathy:    He has no cervical adenopathy.          Assessment & Plan:  Bronchitis, treat with Biaxin and an inhaler prn.  Alysia Penna, MD

## 2017-05-17 NOTE — Addendum Note (Signed)
Addended by: Alysia Penna A on: 05/17/2017 04:30 PM   Modules accepted: Orders

## 2017-05-22 DIAGNOSIS — H401131 Primary open-angle glaucoma, bilateral, mild stage: Secondary | ICD-10-CM | POA: Diagnosis not present

## 2017-06-01 NOTE — Telephone Encounter (Signed)
This encounter was created in error - please disregard.

## 2017-06-11 ENCOUNTER — Other Ambulatory Visit: Payer: Self-pay | Admitting: Family Medicine

## 2017-06-27 ENCOUNTER — Other Ambulatory Visit: Payer: Self-pay

## 2017-06-27 MED ORDER — ALBUTEROL SULFATE HFA 108 (90 BASE) MCG/ACT IN AERS: 2.0000 | INHALATION_SPRAY | RESPIRATORY_TRACT | 11 refills | Status: DC | PRN

## 2017-07-04 ENCOUNTER — Encounter: Payer: Self-pay | Admitting: Pulmonary Disease

## 2017-07-04 ENCOUNTER — Ambulatory Visit: Payer: 59 | Admitting: Pulmonary Disease

## 2017-07-04 DIAGNOSIS — G4733 Obstructive sleep apnea (adult) (pediatric): Secondary | ICD-10-CM

## 2017-07-04 NOTE — Patient Instructions (Signed)
Schedule home sleep study.   

## 2017-07-04 NOTE — Assessment & Plan Note (Signed)
Given obesity, narrow pharyngeal exam & loud snoring, obstructive sleep apnea is very likely & an overnight polysomnogram will be scheduled as a home study. The pathophysiology of obstructive sleep apnea , it's cardiovascular consequences & modes of treatment including CPAP were discused with the patient in detail & they evidenced understanding.  Pretest probability is low.  He does not have hypersomnolence or witnessed apneas.  We will likely only treat him if he has more than mild disease

## 2017-07-04 NOTE — Progress Notes (Signed)
Subjective:    Patient ID: Curtis Duran, male    DOB: 07/22/1955, 62 y.o.   MRN: [id number]  HPI  Chief Complaint  Patient presents with  . Sleep consult    Referred by Dr. Sharlene Motts for sleep apnea.  No sleep study. Epworth score: 10.     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 He has been diagnosed with glaucoma in both eyes and is on 3 eyedrops and his eye specialist recommended evaluation for sleep apnea. He denies excessive daytime somnolence or fatigue.  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  His wife has obstructive sleep apnea and is maintained on CPAP machine.  Epworth sleepiness score is 3. Bedtime is around 11:30 PM, the TV stays on during sleep onset and through the night sleep latency can be about an hour, he sleeps on his side with 2 pillows.  He reports 1-2 nocturnal awakenings based on his sleep habits during his driving days he wakes up every 3-4 hours and then stays awake watching TV until he falls asleep again. He is finally out of bed around 8:30 AM, denies nocturia, denies dryness of mouth or headaches. His weight has remained constant around 278 pounds There is no history suggestive of cataplexy, sleep paralysis or parasomnias  He has diabetes and hyperlipidemia.  He is mildly hypertensive today but does not carry a diagnosis of hypertension     Past Medical History:  Diagnosis Date  . Bronchial pneumonia    back in January  . Carpal tunnel syndrome, bilateral   . Depression   . Diabetes mellitus without complication ([place])   . Eczema    FRONT OF CHEST, MIDDLE OF BACK, FRONT OF LEGS  . GERD (gastroesophageal reflux disease)   . Glaucoma    sees Dr. Camille Bal at [facility].  . Headache(784.0)   . Hyperlipidemia   . Low back pain     Past Surgical History:  Procedure Laterality Date  . COLONOSCOPY  12-19-12   per Dr. Deatra Ina, adenomatous  polyp, repeat in 5 yrs   . has never had surgery    . LIPOMA EXCISION N/A 12/01/2015   Procedure: EXCISION OF LIPOMA OF BACK;  Surgeon: Erroll Luna, MD;  Location: [facility];  Service: General;  Laterality: N/A;   Allergies  Allergen Reactions  . Citalopram Hydrobromide Rash    Social History   Socioeconomic History  . Marital status: Married    Spouse name: Not on file  . Number of children: Not on file  . Years of education: Not on file  . Highest education level: Not on file  Occupational History  . Not on file  Social Needs  . Financial resource strain: Not on file  . Food insecurity:    Worry: Not on file    Inability: Not on file  . Transportation needs:    Medical: Not on file    Non-medical: Not on file  Tobacco Use  . Smoking status: Never Smoker  . Smokeless tobacco: Never Used  Substance and Sexual Activity  . Alcohol use: No    Alcohol/week: 0.0 oz  . Drug use: No  . Sexual activity: Yes  Lifestyle  . Physical activity:    Days per week: Not on file    Minutes per session: Not on file  . Stress: Not on file  Relationships  . Social connections:    Talks  on phone: Not on file    Gets together: Not on file    Attends religious service: Not on file    Active member of club or organization: Not on file    Attends meetings of clubs or organizations: Not on file    Relationship status: Not on file  . Intimate partner violence:    Fear of current or ex partner: Not on file    Emotionally abused: Not on file    Physically abused: Not on file    Forced sexual activity: Not on file  Other Topics Concern  . Not on file  Social History Narrative  . Not on file     Family History  Problem Relation Age of Onset  . Diabetes Unknown   . Cancer Father   . Colon cancer Neg Hx      Review of Systems Constitutional: negative for anorexia, fevers and sweats  Eyes: negative for irritation, redness and visual disturbance  Ears, nose, mouth, throat, and  face: negative for earaches, epistaxis, nasal congestion and sore throat  Respiratory: negative for cough, dyspnea on exertion, sputum and wheezing  Cardiovascular: negative for chest pain, dyspnea, lower extremity edema, orthopnea, palpitations and syncope  Gastrointestinal: negative for abdominal pain, constipation, diarrhea, melena, nausea and vomiting  Genitourinary:negative for dysuria, frequency and hematuria  Hematologic/lymphatic: negative for bleeding, easy bruising and lymphadenopathy  Musculoskeletal:negative for arthralgias, muscle weakness and stiff joints  Neurological: negative for coordination problems, gait problems, headaches and weakness  Endocrine: negative for diabetic symptoms including polydipsia, polyuria and weight loss     Objective:   Physical Exam  Gen. Pleasant, tall, obese, in no distress ENT - class 2 airway, no post nasal drip Neck: No JVD, no thyromegaly, no carotid bruits Lungs: no use of accessory muscles, no dullness to percussion, decreased without rales or rhonchi  Cardiovascular: Rhythm regular, heart sounds  normal, no murmurs or gallops, no peripheral edema Musculoskeletal: No deformities, no cyanosis or clubbing , no tremors       Assessment & Plan:

## 2017-07-26 DIAGNOSIS — G4733 Obstructive sleep apnea (adult) (pediatric): Secondary | ICD-10-CM | POA: Diagnosis not present

## 2017-07-27 ENCOUNTER — Other Ambulatory Visit: Payer: Self-pay | Admitting: *Deleted

## 2017-07-27 DIAGNOSIS — G4733 Obstructive sleep apnea (adult) (pediatric): Secondary | ICD-10-CM

## 2017-07-31 ENCOUNTER — Telehealth: Payer: Self-pay | Admitting: Pulmonary Disease

## 2017-07-31 DIAGNOSIS — G4733 Obstructive sleep apnea (adult) (pediatric): Secondary | ICD-10-CM | POA: Diagnosis not present

## 2017-07-31 NOTE — Telephone Encounter (Signed)
Per RA, HST showed moderate OSA with 15 events per hour. Suggest auto cpap 5-15cm, OV in 6 weeks.

## 2017-08-01 NOTE — Telephone Encounter (Signed)
Spoke with patient. He is aware of RA's recs. He wishes to proceed with the CPAP machine. Explained the process to patient. Will place a reminder on his chart for me to call him in 2-3 weeks to check to see if he has the CPAP machine so I can schedule him for a follow up. He verbalized understanding.   Nothing else needed at time of call.

## 2017-08-16 ENCOUNTER — Telehealth: Payer: Self-pay

## 2017-08-16 NOTE — Telephone Encounter (Signed)
Pt is returning call. Per Pt, he has tried multiple times to call to set up Cpap however has not been able to get a hold of anyone. Cb is [phone].

## 2017-08-16 NOTE — Telephone Encounter (Signed)
Called patient to see if he had received his CPAP machine. Had to leave a message. Will wait for him to call back to get him scheduled for a f/u for CPAP.

## 2017-08-16 NOTE — Telephone Encounter (Signed)
Spoke with patient. He stated that he has tried to call [facility] at least 6 times to setup his CPAP, no one has answered the phone nor called him back.   Spoke with Primitivo Gauze at [facility]. She stated that the RT called the patient twice and had to leave a message. She will reach out to the patient this afternoon to get him setup with his CPAP machine.   Will close this encounter.

## 2017-08-16 NOTE — Telephone Encounter (Signed)
-----   Message from Valerie Salts, [place] sent at 08/01/2017  2:21 PM EDT ----- Regarding: Follow Up On CPAP Order Get patient scheduled for a 6-8 week follow up for his CPAP.

## 2017-08-16 NOTE — Telephone Encounter (Signed)
This message will be routed back to [facility] per triage protocol. Message did not originate in triage nor is it urgent.

## 2017-08-17 DIAGNOSIS — G4733 Obstructive sleep apnea (adult) (pediatric): Secondary | ICD-10-CM | POA: Diagnosis not present

## 2017-08-21 DIAGNOSIS — H401131 Primary open-angle glaucoma, bilateral, mild stage: Secondary | ICD-10-CM | POA: Diagnosis not present

## 2017-09-17 DIAGNOSIS — G4733 Obstructive sleep apnea (adult) (pediatric): Secondary | ICD-10-CM | POA: Diagnosis not present

## 2017-10-09 ENCOUNTER — Encounter: Payer: Self-pay | Admitting: Family Medicine

## 2017-10-09 ENCOUNTER — Ambulatory Visit: Payer: 59 | Admitting: Family Medicine

## 2017-10-09 VITALS — BP 136/86 | HR 98 | Temp 98.5°F | Wt 268.5 lb

## 2017-10-09 DIAGNOSIS — G629 Polyneuropathy, unspecified: Secondary | ICD-10-CM | POA: Diagnosis not present

## 2017-10-09 DIAGNOSIS — Z23 Encounter for immunization: Secondary | ICD-10-CM | POA: Diagnosis not present

## 2017-10-09 DIAGNOSIS — E119 Type 2 diabetes mellitus without complications: Secondary | ICD-10-CM

## 2017-10-09 LAB — VITAMIN B12: Vitamin B-12: 230 pg/mL (ref 211–911)

## 2017-10-09 LAB — HEMOGLOBIN A1C: Hgb A1c MFr Bld: 6.3 % (ref 4.6–6.5)

## 2017-10-09 MED ORDER — GABAPENTIN 100 MG PO CAPS: 100.0000 mg | ORAL_CAPSULE | Freq: Two times a day (BID) | ORAL | 3 refills | Status: DC

## 2017-10-09 NOTE — Progress Notes (Signed)
   Subjective:    Patient ID: Curtis Duran, male    DOB: [birth year], 62 y.o.   MRN: [id number]  HPI Here for 3 months of loss of smell and taste. This appeared fairly suddenly. No trouble with vision. He admits to having numbness and tingling and burning in both hands and both feet for the past year, but he has not spoken about it until today. His last A1c was a year ago.   Review of Systems  Constitutional: Negative.   HENT: Negative for congestion, facial swelling, hearing loss, postnasal drip, sinus pressure, sinus pain and sore throat.   Eyes: Negative.   Respiratory: Negative.   Cardiovascular: Negative.   Neurological: Positive for numbness.       Objective:   Physical Exam  Constitutional: He is oriented to person, place, and time. He appears well-developed and well-nourished.  HENT:  Right Ear: External ear normal.  Left Ear: External ear normal.  Nose: Nose normal.  Mouth/Throat: Oropharynx is clear and moist.  Eyes: Conjunctivae are normal.  Neck: No thyromegaly present.  Cardiovascular: Normal rate, regular rhythm, normal heart sounds and intact distal pulses.  Pulmonary/Chest: Effort normal and breath sounds normal.  Lymphadenopathy:    He has no cervical adenopathy.  Neurological: He is alert and oriented to person, place, and time. No cranial nerve deficit. Coordination normal.          Assessment & Plan:  Loss of smell and taste, possibly related to diabetic neuropathy. Check an A1c and B12 level today. Try Gabapentin 10 mg bid.  Alysia Penna, MD

## 2017-10-17 DIAGNOSIS — G4733 Obstructive sleep apnea (adult) (pediatric): Secondary | ICD-10-CM | POA: Diagnosis not present

## 2017-10-31 ENCOUNTER — Other Ambulatory Visit: Payer: Self-pay | Admitting: Family Medicine

## 2017-11-17 DIAGNOSIS — G4733 Obstructive sleep apnea (adult) (pediatric): Secondary | ICD-10-CM | POA: Diagnosis not present

## 2017-12-17 DIAGNOSIS — G4733 Obstructive sleep apnea (adult) (pediatric): Secondary | ICD-10-CM | POA: Diagnosis not present

## 2017-12-21 DIAGNOSIS — H401131 Primary open-angle glaucoma, bilateral, mild stage: Secondary | ICD-10-CM | POA: Diagnosis not present

## 2018-02-19 ENCOUNTER — Encounter: Payer: Self-pay | Admitting: Family Medicine

## 2018-02-19 ENCOUNTER — Ambulatory Visit (INDEPENDENT_AMBULATORY_CARE_PROVIDER_SITE_OTHER): Payer: BLUE CROSS/BLUE SHIELD | Admitting: Family Medicine

## 2018-02-19 VITALS — BP 132/86 | HR 95 | Ht 76.0 in | Wt 278.2 lb

## 2018-02-19 DIAGNOSIS — Z0000 Encounter for general adult medical examination without abnormal findings: Secondary | ICD-10-CM | POA: Diagnosis not present

## 2018-02-19 DIAGNOSIS — E119 Type 2 diabetes mellitus without complications: Secondary | ICD-10-CM

## 2018-02-19 DIAGNOSIS — Z125 Encounter for screening for malignant neoplasm of prostate: Secondary | ICD-10-CM

## 2018-02-19 LAB — BASIC METABOLIC PANEL
BUN: 18 mg/dL (ref 6–23)
CALCIUM: 10 mg/dL (ref 8.4–10.5)
CO2: 26 mEq/L (ref 19–32)
CREATININE: 1.18 mg/dL (ref 0.40–1.50)
Chloride: 102 mEq/L (ref 96–112)
GFR: 75.18 mL/min (ref >60.00)
GLUCOSE: 96 mg/dL (ref 70–99)
Potassium: 4.2 mEq/L (ref 3.5–5.1)
SODIUM: 138 meq/L (ref 135–145)

## 2018-02-19 LAB — CBC WITH DIFFERENTIAL/PLATELET
BASOS PCT: 0.4 % (ref 0.0–3.0)
Basophils Absolute: 0 10*3/uL (ref 0.0–0.1)
EOS ABS: 0.2 10*3/uL (ref 0.0–0.7)
Eosinophils Relative: 2.9 % (ref 0.0–5.0)
HCT: 47.8 % (ref 39.0–52.0)
Hemoglobin: 15.9 g/dL (ref 13.0–17.0)
Lymphocytes Relative: 31.2 % (ref 12.0–46.0)
Lymphs Abs: 2.5 10*3/uL (ref 0.7–4.0)
MCHC: 33.2 g/dL (ref 30.0–36.0)
MCV: 90 fl (ref 78.0–100.0)
Monocytes Absolute: 0.9 10*3/uL (ref 0.1–1.0)
Monocytes Relative: 11 % (ref 3.0–12.0)
Neutro Abs: 4.3 10*3/uL (ref 1.4–7.7)
Neutrophils Relative %: 54.5 % (ref 43.0–77.0)
Platelets: 280 10*3/uL (ref 150.0–400.0)
RBC: 5.31 Mil/uL (ref 4.22–5.81)
RDW: 14.7 % (ref 11.5–15.5)
WBC: 7.9 10*3/uL (ref 4.0–10.5)

## 2018-02-19 LAB — LIPID PANEL
CHOLESTEROL: 137 mg/dL (ref 0–200)
HDL: 50.3 mg/dL (ref >39.00)
LDL CALC: 65 mg/dL (ref 0–99)
NonHDL: 86.8
Total CHOL/HDL Ratio: 3
Triglycerides: 109 mg/dL (ref 0.0–149.0)
VLDL: 21.8 mg/dL (ref 0.0–40.0)

## 2018-02-19 LAB — HEMOGLOBIN A1C: Hgb A1c MFr Bld: 6.2 % (ref 4.6–6.5)

## 2018-02-19 LAB — HEPATIC FUNCTION PANEL
ALT: 32 U/L (ref 0–53)
AST: 20 U/L (ref 0–37)
Albumin: 4.6 g/dL (ref 3.5–5.2)
Alkaline Phosphatase: 47 U/L (ref 39–117)
Bilirubin, Direct: 0.1 mg/dL (ref 0.0–0.3)
TOTAL PROTEIN: 7 g/dL (ref 6.0–8.3)
Total Bilirubin: 0.4 mg/dL (ref 0.2–1.2)

## 2018-02-19 LAB — TSH: TSH: 2.03 u[IU]/mL (ref 0.35–4.50)

## 2018-02-19 LAB — PSA: PSA: 2.42 ng/mL (ref 0.10–4.00)

## 2018-02-19 MED ORDER — DULOXETINE HCL 30 MG PO CPEP: 30.0000 mg | ORAL_CAPSULE | Freq: Every day | ORAL | 3 refills | Status: DC

## 2018-02-19 NOTE — Progress Notes (Signed)
Subjective:    Patient ID: Curtis Duran, male    DOB: 28-Jun-1955, 63 y.o.   MRN: [id number]  HPI Here for a well exam. He feels well physically but he as been dealing with more depression lately. He has been on Wellbutrin XL 300 mg daily for several years, however he has been dealing with some family issues lately that have really dragged him down. His daughter and her boyfriend have been implicated in child abuse charges, and her child (his granddaughter) has been placed with a foster family. Now the authorities will not allow Curtis Duran or his wife to see the granddaughter, and this has been very difficult for him. He feels depressed and sad, he is often tearful, his appetite is down and he has trouble sleeping. He denies any suicidal thoughts. He is interested in talking to a therapist.    Review of Systems  Constitutional: Negative.   HENT: Negative.   Eyes: Negative.   Respiratory: Negative.   Cardiovascular: Negative.   Gastrointestinal: Negative.   Genitourinary: Negative.   Musculoskeletal: Negative.   Skin: Negative.   Neurological: Negative.   Psychiatric/Behavioral: Positive for dysphoric mood and sleep disturbance. Negative for agitation, hallucinations, self-injury and suicidal ideas. The patient is nervous/anxious.        Objective:   Physical Exam Constitutional:      General: He is not in acute distress.    Appearance: He is well-developed. He is not diaphoretic.  HENT:     Head: Normocephalic and atraumatic.     Right Ear: External ear normal.     Left Ear: External ear normal.     Nose: Nose normal.     Mouth/Throat:     Pharynx: No oropharyngeal exudate.  Eyes:     General: No scleral icterus.       Right eye: No discharge.        Left eye: No discharge.     Conjunctiva/sclera: Conjunctivae normal.     Pupils: Pupils are equal, round, and reactive to light.  Neck:     Musculoskeletal: Neck supple.     Thyroid: No thyromegaly.     Vascular: No JVD.   Trachea: No tracheal deviation.  Cardiovascular:     Rate and Rhythm: Normal rate and regular rhythm.     Heart sounds: Normal heart sounds. No murmur. No friction rub. No gallop.   Pulmonary:     Effort: Pulmonary effort is normal. No respiratory distress.     Breath sounds: Normal breath sounds. No wheezing or rales.  Chest:     Chest wall: No tenderness.  Abdominal:     General: Bowel sounds are normal. There is no distension.     Palpations: Abdomen is soft. There is no mass.     Tenderness: There is no abdominal tenderness. There is no guarding or rebound.  Genitourinary:    Penis: Normal. No tenderness.      Prostate: Normal.     Rectum: Normal. Guaiac result negative.  Musculoskeletal: Normal range of motion.        General: No tenderness.  Lymphadenopathy:     Cervical: No cervical adenopathy.  Skin:    General: Skin is warm and dry.     Coloration: Skin is not pale.     Findings: No erythema or rash.  Neurological:     Mental Status: He is alert and oriented to person, place, and time.     Cranial Nerves: No cranial nerve deficit.  Motor: No abnormal muscle tone.     Coordination: Coordination normal.     Deep Tendon Reflexes: Reflexes are normal and symmetric. Reflexes normal.  Psychiatric:        Behavior: Behavior normal.        Thought Content: Thought content normal.        Judgment: Judgment normal.           Assessment & Plan:  Well exam. We discussed diet and exercise. Get fasting labs. For the depression we will add Cymbalta 30 mg daily to the Wellbutrin. I gave him contact information to talk to [facility] to start therapy. Recheck here in 3-4 weeks. Set up another colonoscopy.  Alysia Penna, MD

## 2018-02-21 ENCOUNTER — Encounter: Payer: Self-pay | Admitting: Gastroenterology

## 2018-03-07 ENCOUNTER — Encounter: Payer: Self-pay | Admitting: Gastroenterology

## 2018-03-07 ENCOUNTER — Ambulatory Visit (AMBULATORY_SURGERY_CENTER): Payer: Self-pay | Admitting: *Deleted

## 2018-03-07 VITALS — Ht 76.0 in | Wt 274.0 lb

## 2018-03-07 DIAGNOSIS — Z860100 Personal history of colon polyps, unspecified: Secondary | ICD-10-CM

## 2018-03-07 MED ORDER — NA SULFATE-K SULFATE-MG SULF 17.5-3.13-1.6 GM/177ML PO SOLN: 1.0000 | Freq: Once | ORAL | 0 refills | Status: AC

## 2018-03-07 NOTE — Progress Notes (Signed)
No egg or soy allergy known to patient  No issues with past sedation with any surgeries  or procedures, no intubation problems  No diet pills per patient No home 02 use per patient  No blood thinners per patient  Pt denies issues with constipation  No A fib or A flutter  EMMI video sent to pt's e mail - pt declined  Suprep $15 coupon to pt

## 2018-03-08 ENCOUNTER — Other Ambulatory Visit: Payer: Self-pay | Admitting: Family Medicine

## 2018-03-12 ENCOUNTER — Other Ambulatory Visit: Payer: Self-pay | Admitting: Family Medicine

## 2018-03-14 ENCOUNTER — Other Ambulatory Visit: Payer: Self-pay | Admitting: Family Medicine

## 2018-03-19 ENCOUNTER — Encounter: Payer: Self-pay | Admitting: Gastroenterology

## 2018-03-19 ENCOUNTER — Ambulatory Visit (AMBULATORY_SURGERY_CENTER): Payer: BLUE CROSS/BLUE SHIELD | Admitting: Gastroenterology

## 2018-03-19 VITALS — BP 145/75 | HR 89 | Temp 98.4°F | Resp 18 | Ht 76.0 in | Wt 278.0 lb

## 2018-03-19 DIAGNOSIS — D122 Benign neoplasm of ascending colon: Secondary | ICD-10-CM

## 2018-03-19 DIAGNOSIS — D120 Benign neoplasm of cecum: Secondary | ICD-10-CM | POA: Diagnosis not present

## 2018-03-19 DIAGNOSIS — Z86010 Personal history of colonic polyps: Secondary | ICD-10-CM | POA: Diagnosis not present

## 2018-03-19 MED ORDER — SODIUM CHLORIDE 0.9 % IV SOLN: 500.0000 mL | Freq: Once | INTRAVENOUS | Status: DC

## 2018-03-19 NOTE — Progress Notes (Signed)
Pt's states no medical or surgical changes since previsit or office visit. 

## 2018-03-19 NOTE — Op Note (Signed)
[facility] Patient Name: Curtis Duran Procedure Date: 03/19/2018 8:39 AM MRN: [id number] Endoscopist: [place] Loletha Carrow , MD Age: 63 Referring MD:  Date of Birth: May 10, 1955 Gender: Male Account #: [id number] Procedure:                Colonoscopy Indications:              Surveillance: Personal history of adenomatous                            polyps on last colonoscopy 5 years ago (65mm TA                            12/2012) Medicines:                Monitored Anesthesia Care Procedure:                Pre-Anesthesia Assessment:                           - Prior to the procedure, a History and Physical                            was performed, and patient medications and                            allergies were reviewed. The patient's tolerance of                            previous anesthesia was also reviewed. The risks                            and benefits of the procedure and the sedation                            options and risks were discussed with the patient.                            All questions were answered, and informed consent                            was obtained. Prior Anticoagulants: The patient has                            taken no previous anticoagulant or antiplatelet                            agents. ASA Grade Assessment: II - A patient with                            mild systemic disease. After reviewing the risks                            and benefits, the patient was deemed in  satisfactory condition to undergo the procedure.                           After obtaining informed consent, the colonoscope                            was passed under direct vision. Throughout the                            procedure, the patient's blood pressure, pulse, and                            oxygen saturations were monitored continuously. The                            Colonoscope was introduced through the anus and                advanced to the the cecum, identified by                            appendiceal orifice and ileocecal valve. The                            colonoscopy was performed with difficulty due to                            significant looping. Successful completion of the                            procedure was aided by using manual pressure. The                            patient tolerated the procedure well. The quality                            of the bowel preparation was excellent. The                            ileocecal valve, appendiceal orifice, and rectum                            were photographed. The bowel preparation used was                            SUPREP. Scope In: 8:45:11 AM Scope Out: 9:13:13 AM Scope Withdrawal Time: 0 hours 16 minutes 53 seconds  Total Procedure Duration: 0 hours 28 minutes 2 seconds  Findings:                 The digital rectal exam findings include decreased                            sphincter tone.  Two sessile polyps were found in the ascending                            colon. The polyps were diminutive in size. These                            polyps were removed with a cold biopsy forceps.                            Resection and retrieval were complete. (Jar 1)                           A 4 mm polyp was found in the cecum. The polyp was                            sessile. The polyp was removed with a cold snare.                            Resection and retrieval were complete. (Jar 1)                           A 10 mm polyp was found in the ascending colon. The                            polyp was sessile. The polyp was removed with a hot                            snare. Resection and retrieval were complete.                           Multiple diverticula were found in the entire colon.                           The colon (entire examined portion) was                            significantly redundant.                            Retroflexion in the rectum was not performed due to                            anatomy. Complications:            No immediate complications. Estimated Blood Loss:     Estimated blood loss was minimal. Impression:               - Decreased sphincter tone found on digital rectal                            exam.                           - Two diminutive polyps in the ascending colon,  removed with a cold biopsy forceps. Resected and                            retrieved.                           - One 4 mm polyp in the cecum, removed with a cold                            snare. Resected and retrieved.                           - One 10 mm polyp in the ascending colon, removed                            with a hot snare. Resected and retrieved.                           - Diverticulosis in the entire examined colon.                           - Redundant colon. Recommendation:           - Patient has a contact number available for                            emergencies. The signs and symptoms of potential                            delayed complications were discussed with the                            patient. Return to normal activities tomorrow.                            Written discharge instructions were provided to the                            patient.                           - Resume previous diet.                           - Continue present medications.                           - Await pathology results.                           - Repeat colonoscopy is recommended for                            surveillance. The colonoscopy date will be  determined after pathology results from today's                            exam become available for review. Henry L. Loletha Carrow, MD 03/19/2018 9:18:51 AM This report has been signed electronically.

## 2018-03-19 NOTE — Patient Instructions (Signed)
YOU HAD AN ENDOSCOPIC PROCEDURE TODAY AT [facility]:   Refer to the procedure report that was given to you for any specific questions about what was found during the examination.  If the procedure report does not answer your questions, please call your gastroenterologist to clarify.  If you requested that your care partner not be given the details of your procedure findings, then the procedure report has been included in a sealed envelope for you to review at your convenience later.  YOU SHOULD EXPECT: Some feelings of bloating in the abdomen. Passage of more gas than usual.  Walking can help get rid of the air that was put into your GI tract during the procedure and reduce the bloating. If you had a lower endoscopy (such as a colonoscopy or flexible sigmoidoscopy) you may notice spotting of blood in your stool or on the toilet paper. If you underwent a bowel prep for your procedure, you may not have a normal bowel movement for a few days.  Please Note:  You might notice some irritation and congestion in your nose or some drainage.  This is from the oxygen used during your procedure.  There is no need for concern and it should clear up in a day or so.  SYMPTOMS TO REPORT IMMEDIATELY:   Following lower endoscopy (colonoscopy or flexible sigmoidoscopy):  Excessive amounts of blood in the stool  Significant tenderness or worsening of abdominal pains  Swelling of the abdomen that is new, acute  Fever of 100F or higher   For urgent or emergent issues, a gastroenterologist can be reached at any hour by calling [phone].   DIET:  We do recommend a small meal at first, but then you may proceed to your regular diet.  Drink plenty of fluids but you should avoid alcoholic beverages for 24 hours. Try to eat a high fiber diet, and drink plenty of water.  ACTIVITY:  You should plan to take it easy for the rest of today and you should NOT DRIVE or use heavy machinery until tomorrow  (because of the sedation medicines used during the test).    FOLLOW UP: Our staff will call the number listed on your records the next business day following your procedure to check on you and address any questions or concerns that you may have regarding the information given to you following your procedure. If we do not reach you, we will leave a message.  However, if you are feeling well and you are not experiencing any problems, there is no need to return our call.  We will assume that you have returned to your regular daily activities without incident.  If any biopsies were taken you will be contacted by phone or by letter within the next 1-3 weeks.  Please call us at [phone] if you have not heard about the biopsies in 3 weeks.   Wear you CPAP machine!!!!  SIGNATURES/CONFIDENTIALITY: You and/or your care partner have signed paperwork which will be entered into your electronic medical record.  These signatures attest to the fact that that the information above on your After Visit Summary has been reviewed and is understood.  Full responsibility of the confidentiality of this discharge information lies with you and/or your care-partner.  Read all information given to you by your recovery room nurse.

## 2018-03-19 NOTE — Progress Notes (Signed)
Called to room to assist during endoscopic procedure.  Patient ID and intended procedure confirmed with present staff. Received instructions for my participation in the procedure from the performing physician.  

## 2018-03-19 NOTE — Progress Notes (Signed)
Report given to PACU, vss 

## 2018-03-20 ENCOUNTER — Telehealth: Payer: Self-pay

## 2018-03-20 NOTE — Telephone Encounter (Signed)
  Follow up Call-  Call back number 03/19/2018  Post procedure Call Back phone  # [phone]  Permission to leave phone message Yes  Some recent data might be hidden     Patient questions:  Do you have a fever, pain , or abdominal swelling? No. Pain Score  0 *  Have you tolerated food without any problems? Yes.    Have you been able to return to your normal activities? Yes.    Do you have any questions about your discharge instructions: Diet   No. Medications  No. Follow up visit  No.  Do you have questions or concerns about your Care? No.  Actions: * If pain score is 4 or above: No action needed, pain <4.

## 2018-03-22 DIAGNOSIS — H401131 Primary open-angle glaucoma, bilateral, mild stage: Secondary | ICD-10-CM | POA: Diagnosis not present

## 2018-03-27 ENCOUNTER — Encounter: Payer: Self-pay | Admitting: Gastroenterology

## 2018-04-12 ENCOUNTER — Other Ambulatory Visit: Payer: Self-pay | Admitting: Family Medicine

## 2018-07-19 ENCOUNTER — Encounter: Payer: Self-pay | Admitting: Family Medicine

## 2018-07-19 DIAGNOSIS — H401132 Primary open-angle glaucoma, bilateral, moderate stage: Secondary | ICD-10-CM | POA: Diagnosis not present

## 2018-07-19 DIAGNOSIS — E119 Type 2 diabetes mellitus without complications: Secondary | ICD-10-CM | POA: Diagnosis not present

## 2018-07-19 DIAGNOSIS — H5213 Myopia, bilateral: Secondary | ICD-10-CM | POA: Diagnosis not present

## 2018-07-19 LAB — HM DIABETES EYE EXAM

## 2018-07-30 DIAGNOSIS — Z7189 Other specified counseling: Secondary | ICD-10-CM | POA: Diagnosis not present

## 2018-07-30 DIAGNOSIS — Z20828 Contact with and (suspected) exposure to other viral communicable diseases: Secondary | ICD-10-CM | POA: Diagnosis not present

## 2018-07-30 DIAGNOSIS — Z03818 Encounter for observation for suspected exposure to other biological agents ruled out: Secondary | ICD-10-CM | POA: Diagnosis not present

## 2018-10-19 DIAGNOSIS — H401132 Primary open-angle glaucoma, bilateral, moderate stage: Secondary | ICD-10-CM | POA: Diagnosis not present

## 2018-10-23 ENCOUNTER — Ambulatory Visit (INDEPENDENT_AMBULATORY_CARE_PROVIDER_SITE_OTHER): Payer: BC Managed Care – PPO

## 2018-10-23 ENCOUNTER — Other Ambulatory Visit: Payer: Self-pay

## 2018-10-23 DIAGNOSIS — Z23 Encounter for immunization: Secondary | ICD-10-CM | POA: Diagnosis not present

## 2018-12-25 ENCOUNTER — Other Ambulatory Visit: Payer: Self-pay | Admitting: Family Medicine

## 2018-12-25 ENCOUNTER — Ambulatory Visit: Payer: Self-pay

## 2018-12-25 MED ORDER — DULOXETINE HCL 30 MG PO CPEP: ORAL_CAPSULE | ORAL | 0 refills | Status: DC

## 2018-12-25 NOTE — Telephone Encounter (Signed)
Medication Refill - Medication: DULoxetine (CYMBALTA) 30 MG capsule  Pt has a CPE sched in February when he is due, but states his bottle says he has no refills. Please advise.    Preferred Pharmacy:  [id number] - [place], [place] [id number] (Phone) [phone] (Fax)     Pt was  advised that RX refills may take up to 3 business days. We ask that you follow-up with your pharmacy.

## 2018-12-25 NOTE — Telephone Encounter (Signed)
Incoming call from Pt.  With a complaint of dizziness.  Patient states that he recently stop taking one of his medications and on Sunday, became dizzy since stopping.  Awaiting Rx. To be filled.  Rates dizziness mild.  Requested a appointment.           Reason for Disposition . [1] MILD dizziness (e.g., walking normally) AND [2] has NOT been evaluated by physician for this  (Exception: dizziness caused by heat exposure, sudden standing, or poor fluid intake)  Answer Assessment - Initial Assessment Questions 1. DESCRIPTION: "Describe your dizziness."    When I get up I feel lightheadness 2. LIGHTHEADED: "Do you feel lightheaded?" (e.g., somewhat faint, woozy, weak upon standing)     *No Answer* 3. VERTIGO: "Do you feel like either you or the room is spinning or tilting?" (i.e. vertigo)     *No Answer* 4. SEVERITY: "How bad is it?"  "Do you feel like you are going to faint?" "Can you stand and walk?"   - MILD - walking normally   - MODERATE - interferes with normal activities (e.g., work, school)    - SEVERE - unable to stand, requires support to walk, feels like passing out now.     mild 5. ONSET:  "When did the dizziness begin?"      Monday morning.   6. AGGRAVATING FACTORS: "Does anything make it worse?" (e.g., standing, change in head position)    movining aroud change head position.   7. HEART RATE: "Can you tell me your heart rate?" "How many beats in 15 seconds?"  (Note: not all patients can do this)       8. CAUSE: "What do you think is causing the dizziness?"     *No Answer* 9. RECURRENT SYMPTOM: "Have you had dizziness before?" If so, ask: "When was the last time?" "What happened that time?"     *No Answer* 10. OTHER SYMPTOMS: "Do you have any other symptoms?" (e.g., fever, chest pain, vomiting, diarrhea, bleeding)       denies 11. PREGNANCY: "Is there any chance you are pregnant?" "When was your last menstrual period?"       na  Protocols used: DIZZINESS [facility]

## 2018-12-25 NOTE — Telephone Encounter (Signed)
Patient has appointment on 12/27/2018.

## 2018-12-25 NOTE — Telephone Encounter (Signed)
Requested medication (s) are due for refill today: yes  Requested medication (s) are on the active medication list: yes  Last refill:  03/14/2018  Future visit scheduled: yes  Notes to clinic: Patient has appointment in 2 days   Requested Prescriptions  Pending Prescriptions Disp Refills   DULoxetine (CYMBALTA) 30 MG capsule      Sig: Take by mouth daily.     Psychiatry: Antidepressants - SNRI Failed - 12/25/2018 10:51 AM      Failed - Last BP in normal range    BP Readings from Last 1 Encounters:  03/19/18 (!) 145/75         Failed - Valid encounter within last 6 months    Recent Outpatient Visits          10 months ago Preventative health care   [facility] [place], Ishmael Holter, MD   1 year ago Neuropathy   [facility] [place], Ishmael Holter, MD   1 year ago Acute bronchitis, unspecified organism   [facility], Ishmael Holter, MD   1 year ago Preventative health care   [facility] [place], Ishmael Holter, MD   2 years ago Acute bronchitis, unspecified organism   [facility] [place], [place]      Future Appointments            In 2 days Laurey Morale, MD [facility] [place], [place]   In 2 months Laurey Morale, MD [facility] [place], [place] - Completed PHQ-2 or PHQ-9 in the last 360 days.

## 2018-12-27 ENCOUNTER — Encounter: Payer: Self-pay | Admitting: Family Medicine

## 2018-12-27 ENCOUNTER — Ambulatory Visit: Payer: BC Managed Care – PPO | Admitting: Family Medicine

## 2018-12-27 ENCOUNTER — Other Ambulatory Visit: Payer: Self-pay

## 2018-12-27 VITALS — BP 120/88 | HR 111 | Temp 98.2°F | Wt 267.0 lb

## 2018-12-27 DIAGNOSIS — F321 Major depressive disorder, single episode, moderate: Secondary | ICD-10-CM

## 2018-12-27 DIAGNOSIS — M545 Low back pain, unspecified: Secondary | ICD-10-CM

## 2018-12-27 DIAGNOSIS — E119 Type 2 diabetes mellitus without complications: Secondary | ICD-10-CM

## 2018-12-27 DIAGNOSIS — G8929 Other chronic pain: Secondary | ICD-10-CM

## 2018-12-27 DIAGNOSIS — R42 Dizziness and giddiness: Secondary | ICD-10-CM | POA: Diagnosis not present

## 2018-12-27 MED ORDER — DICLOFENAC SODIUM 75 MG PO TBEC: 75.0000 mg | DELAYED_RELEASE_TABLET | Freq: Two times a day (BID) | ORAL | 5 refills | Status: DC

## 2018-12-27 NOTE — Progress Notes (Signed)
   Subjective:    Patient ID: Curtis Duran, male    DOB: 1955-05-20, 63 y.o.   MRN: [id number]  HPI Here for several issues. We started him on Cymbalta to take along with his Wellbutrin in February, and this has been working very well fro his depression. However the prescription ran out last weekend, and the first day he did not take the Cymbalta he felt hot and dizzy. The dizziness continued for 3 more days, until his prescription was renewed. Then yesterday when he took the first dose of Cymbalta again, he immediately felt better. Today he feels better still and the dizziness has totally resolved. Other wise his BP has been stable. His diabetes has been fairly stable. His am fasting glucose this am was 134. Also he has had an intermittent pain in the right lower back for years and lately this has gotten a little worse. Tylenol and Ibuprofen help a little. No pain down the legs.    Review of Systems  Constitutional: Negative.   Respiratory: Negative.   Cardiovascular: Negative.   Gastrointestinal: Negative.   Genitourinary: Negative.   Musculoskeletal: Positive for back pain.  Neurological: Positive for dizziness. Negative for headaches.       Objective:   Physical Exam Constitutional:      Appearance: Normal appearance.  Cardiovascular:     Rate and Rhythm: Normal rate and regular rhythm.     Pulses: Normal pulses.     Heart sounds: Normal heart sounds.  Pulmonary:     Effort: Pulmonary effort is normal.     Breath sounds: Normal breath sounds.  Musculoskeletal:     Comments: He is tender along the right side of the lumbar spine, ROM is full   Neurological:     General: No focal deficit present.     Mental Status: He is alert and oriented to person, place, and time.     Cranial Nerves: No cranial nerve deficit.     Motor: No weakness.     Coordination: Coordination normal.     Gait: Gait normal.  Psychiatric:        Mood and Affect: Mood normal.        Behavior: Behavior  normal.        Thought Content: Thought content normal.        Judgment: Judgment normal.           Assessment & Plan:  The dizziness was almost certainly the result of suddenly stopping the Cymbalta. Now thayt he is back on it, he feels better. His depression is well controlled so he will continue to take this. His HTN and diabetes are stable. The low back pain is likely due to degenerative disc issues. Try Diclofenac as needed.  Alysia Penna, MD

## 2019-01-29 ENCOUNTER — Ambulatory Visit: Payer: BC Managed Care – PPO | Attending: Internal Medicine

## 2019-01-29 DIAGNOSIS — Z20822 Contact with and (suspected) exposure to covid-19: Secondary | ICD-10-CM

## 2019-01-31 LAB — NOVEL CORONAVIRUS, NAA: SARS-CoV-2, NAA: NOT DETECTED

## 2019-02-15 DIAGNOSIS — H401132 Primary open-angle glaucoma, bilateral, moderate stage: Secondary | ICD-10-CM | POA: Diagnosis not present

## 2019-02-24 ENCOUNTER — Other Ambulatory Visit: Payer: Self-pay | Admitting: Family Medicine

## 2019-02-26 ENCOUNTER — Other Ambulatory Visit: Payer: Self-pay

## 2019-02-27 ENCOUNTER — Ambulatory Visit (INDEPENDENT_AMBULATORY_CARE_PROVIDER_SITE_OTHER): Payer: BC Managed Care – PPO | Admitting: Family Medicine

## 2019-02-27 ENCOUNTER — Encounter: Payer: Self-pay | Admitting: Family Medicine

## 2019-02-27 VITALS — BP 140/80 | HR 111 | Temp 97.9°F | Wt 272.8 lb

## 2019-02-27 DIAGNOSIS — E119 Type 2 diabetes mellitus without complications: Secondary | ICD-10-CM | POA: Diagnosis not present

## 2019-02-27 DIAGNOSIS — Z23 Encounter for immunization: Secondary | ICD-10-CM | POA: Diagnosis not present

## 2019-02-27 DIAGNOSIS — G8929 Other chronic pain: Secondary | ICD-10-CM

## 2019-02-27 DIAGNOSIS — Z0000 Encounter for general adult medical examination without abnormal findings: Secondary | ICD-10-CM

## 2019-02-27 DIAGNOSIS — M25562 Pain in left knee: Secondary | ICD-10-CM | POA: Diagnosis not present

## 2019-02-27 DIAGNOSIS — M25561 Pain in right knee: Secondary | ICD-10-CM

## 2019-02-27 LAB — CBC WITH DIFFERENTIAL/PLATELET
Basophils Absolute: 0.1 10*3/uL (ref 0.0–0.1)
Basophils Relative: 0.7 % (ref 0.0–3.0)
Eosinophils Absolute: 0.2 10*3/uL (ref 0.0–0.7)
Eosinophils Relative: 3.1 % (ref 0.0–5.0)
HCT: 45.9 % (ref 39.0–52.0)
Hemoglobin: 15.1 g/dL (ref 13.0–17.0)
Lymphocytes Relative: 29 % (ref 12.0–46.0)
Lymphs Abs: 2.3 10*3/uL (ref 0.7–4.0)
MCHC: 33 g/dL (ref 30.0–36.0)
MCV: 91.7 fl (ref 78.0–100.0)
Monocytes Absolute: 0.7 10*3/uL (ref 0.1–1.0)
Monocytes Relative: 9 % (ref 3.0–12.0)
Neutro Abs: 4.6 10*3/uL (ref 1.4–7.7)
Neutrophils Relative %: 58.2 % (ref 43.0–77.0)
Platelets: 272 10*3/uL (ref 150.0–400.0)
RBC: 5.01 Mil/uL (ref 4.22–5.81)
RDW: 13.8 % (ref 11.5–15.5)
WBC: 7.9 10*3/uL (ref 4.0–10.5)

## 2019-02-27 LAB — BASIC METABOLIC PANEL
BUN: 15 mg/dL (ref 6–23)
CO2: 26 mEq/L (ref 19–32)
Calcium: 9.5 mg/dL (ref 8.4–10.5)
Chloride: 102 mEq/L (ref 96–112)
Creatinine, Ser: 1.09 mg/dL (ref 0.40–1.50)
GFR: 82.13 mL/min (ref >60.00)
Glucose, Bld: 96 mg/dL (ref 70–99)
Potassium: 4.2 mEq/L (ref 3.5–5.1)
Sodium: 137 mEq/L (ref 135–145)

## 2019-02-27 LAB — HEPATIC FUNCTION PANEL
ALT: 31 U/L (ref 0–53)
AST: 21 U/L (ref 0–37)
Albumin: 4.5 g/dL (ref 3.5–5.2)
Alkaline Phosphatase: 45 U/L (ref 39–117)
Bilirubin, Direct: 0.1 mg/dL (ref 0.0–0.3)
Total Bilirubin: 0.4 mg/dL (ref 0.2–1.2)
Total Protein: 7 g/dL (ref 6.0–8.3)

## 2019-02-27 LAB — URINALYSIS
Bilirubin Urine: NEGATIVE
Ketones, ur: NEGATIVE
Leukocytes,Ua: NEGATIVE
Nitrite: NEGATIVE
Specific Gravity, Urine: 1.01 (ref 1.000–1.030)
Total Protein, Urine: NEGATIVE
Urine Glucose: NEGATIVE
Urobilinogen, UA: 0.2 (ref 0.0–1.0)
pH: 6.5 (ref 5.0–8.0)

## 2019-02-27 LAB — TSH: TSH: 1.53 u[IU]/mL (ref 0.35–4.50)

## 2019-02-27 LAB — LIPID PANEL
Cholesterol: 140 mg/dL (ref 0–200)
HDL: 59.6 mg/dL (ref >39.00)
LDL Cholesterol: 62 mg/dL (ref 0–99)
NonHDL: 80.51
Total CHOL/HDL Ratio: 2
Triglycerides: 92 mg/dL (ref 0.0–149.0)
VLDL: 18.4 mg/dL (ref 0.0–40.0)

## 2019-02-27 LAB — HEMOGLOBIN A1C: Hgb A1c MFr Bld: 6.4 % (ref 4.6–6.5)

## 2019-02-27 LAB — PSA: PSA: 3.91 ng/mL (ref 0.10–4.00)

## 2019-02-27 MED ORDER — ROSUVASTATIN CALCIUM 10 MG PO TABS: ORAL_TABLET | ORAL | 3 refills | Status: DC

## 2019-02-27 MED ORDER — BUPROPION HCL ER (XL) 300 MG PO TB24: 300.0000 mg | ORAL_TABLET | Freq: Every day | ORAL | 3 refills | Status: DC

## 2019-02-27 MED ORDER — METFORMIN HCL 500 MG PO TABS: 500.0000 mg | ORAL_TABLET | Freq: Two times a day (BID) | ORAL | 3 refills | Status: DC

## 2019-02-27 NOTE — Progress Notes (Signed)
   Subjective:    Patient ID: Curtis Duran, male    DOB: 02-12-1955, 64 y.o.   MRN: [id number]  HPI Here for a well exam. He feels great.    Review of Systems  Constitutional: Negative.   HENT: Negative.   Eyes: Negative.   Respiratory: Negative.   Cardiovascular: Negative.   Gastrointestinal: Negative.   Genitourinary: Negative.   Musculoskeletal: Negative.   Skin: Negative.   Neurological: Negative.   Psychiatric/Behavioral: Negative.        Objective:   Physical Exam Constitutional:      General: He is not in acute distress.    Appearance: He is well-developed. He is not diaphoretic.  HENT:     Head: Normocephalic and atraumatic.     Right Ear: External ear normal.     Left Ear: External ear normal.     Nose: Nose normal.     Mouth/Throat:     Pharynx: No oropharyngeal exudate.  Eyes:     General: No scleral icterus.       Right eye: No discharge.        Left eye: No discharge.     Conjunctiva/sclera: Conjunctivae normal.     Pupils: Pupils are equal, round, and reactive to light.  Neck:     Thyroid: No thyromegaly.     Vascular: No JVD.     Trachea: No tracheal deviation.  Cardiovascular:     Rate and Rhythm: Normal rate and regular rhythm.     Heart sounds: Normal heart sounds. No murmur. No friction rub. No gallop.   Pulmonary:     Effort: Pulmonary effort is normal. No respiratory distress.     Breath sounds: Normal breath sounds. No wheezing or rales.  Chest:     Chest wall: No tenderness.  Abdominal:     General: Bowel sounds are normal. There is no distension.     Palpations: Abdomen is soft. There is no mass.     Tenderness: There is no abdominal tenderness. There is no guarding or rebound.  Genitourinary:    Penis: Normal. No tenderness.      Testes: Normal.     Prostate: Normal.     Rectum: Normal. Guaiac result negative.  Musculoskeletal:        General: No tenderness. Normal range of motion.     Cervical back: Neck supple.    Lymphadenopathy:     Cervical: No cervical adenopathy.  Skin:    General: Skin is warm and dry.     Coloration: Skin is not pale.     Findings: No erythema or rash.  Neurological:     Mental Status: He is alert and oriented to person, place, and time.     Cranial Nerves: No cranial nerve deficit.     Motor: No abnormal muscle tone.     Coordination: Coordination normal.     Deep Tendon Reflexes: Reflexes are normal and symmetric. Reflexes normal.  Psychiatric:        Behavior: Behavior normal.        Thought Content: Thought content normal.        Judgment: Judgment normal.           Assessment & Plan:  Well exam. We discussed diet and exercise. Get fasting labs. Given a Prevnar 13 vaccine. He has already received the first Covid vaccine.  Alysia Penna, MD

## 2019-02-28 LAB — ABO AND RH

## 2019-03-05 ENCOUNTER — Other Ambulatory Visit: Payer: Self-pay

## 2019-03-05 ENCOUNTER — Ambulatory Visit: Payer: Self-pay

## 2019-03-05 ENCOUNTER — Ambulatory Visit: Payer: BC Managed Care – PPO | Admitting: Orthopaedic Surgery

## 2019-03-05 ENCOUNTER — Encounter: Payer: Self-pay | Admitting: Orthopaedic Surgery

## 2019-03-05 DIAGNOSIS — M1712 Unilateral primary osteoarthritis, left knee: Secondary | ICD-10-CM

## 2019-03-05 DIAGNOSIS — M1711 Unilateral primary osteoarthritis, right knee: Secondary | ICD-10-CM

## 2019-03-05 MED ORDER — CELECOXIB 200 MG PO CAPS: 200.0000 mg | ORAL_CAPSULE | Freq: Two times a day (BID) | ORAL | 3 refills | Status: DC

## 2019-03-05 NOTE — Progress Notes (Signed)
Office Visit Note   Patient: Curtis Duran           Date of Birth: [birth year]           MRN: [id number] Visit Date: 03/05/2019              Requested by: Laurey Morale, MD [place],  [place] [id number] PCP: Laurey Morale, MD   Assessment & Plan: Visit Diagnoses:  1. Primary osteoarthritis of right knee   2. Primary osteoarthritis of left knee     Plan: Impression is bilateral knee osteoarthritis mainly patellofemoral.  We discussed the importance of weight loss and strengthening.  We will start with Celebrex and Voltaren gel.  Patient not interested in cortisone injections at this time.  Follow-Up Instructions: Return if symptoms worsen or fail to improve.   Orders:  Orders Placed This Encounter  Procedures  . XR KNEE 3 VIEW RIGHT  . XR KNEE 3 VIEW LEFT   Meds ordered this encounter  Medications  . celecoxib (CELEBREX) 200 MG capsule    Sig: Take 1 capsule (200 mg total) by mouth 2 (two) times daily.    Dispense:  60 capsule    Refill:  3      Procedures: No procedures performed   Clinical Data: No additional findings.   Subjective: Chief Complaint  Patient presents with  . Right Knee - Pain  . Left Knee - Pain    Curtis Duran is a very pleasant 64 year old gentleman who comes in for evaluation of bilateral knee pain for several years.  The pain is localized to the top of the patella and behind the patella.  He denies any definite injuries and he has never had knee surgery before.  He states the right side is worse.  He has trouble walking for exercise with his wife.  He denies any swelling.  He endorses cracking and popping and occasional burning sensation.  Denies any numbness and tingling.  Denies any mechanical symptoms.   Review of Systems  Constitutional: Negative.   All other systems reviewed and are negative.    Objective: Vital Signs: There were no vitals taken for this visit.  Physical Exam Vitals and nursing note reviewed.    Constitutional:      Appearance: He is well-developed.  HENT:     Head: Normocephalic and atraumatic.  Eyes:     Pupils: Pupils are equal, round, and reactive to light.  Pulmonary:     Effort: Pulmonary effort is normal.  Abdominal:     Palpations: Abdomen is soft.  Musculoskeletal:        General: Normal range of motion.     Cervical back: Neck supple.  Skin:    General: Skin is warm.  Neurological:     Mental Status: He is alert and oriented to person, place, and time.  Psychiatric:        Behavior: Behavior normal.        Thought Content: Thought content normal.        Judgment: Judgment normal.     Ortho Exam Right knee exam shows no joint effusion.  1+ patellofemoral crepitus with range of motion.  No significant restriction range of motion.  No significant pain with range of motion.  Collaterals and cruciates are stable.  No joint line tenderness.  No tenderness to palpation of the quadriceps tendon.  Left knee exam is the same as the right. Specialty Comments:  No specialty comments available.  Imaging: XR KNEE 3 VIEW LEFT  Result Date: 03/05/2019 Periarticular spurring.  Mild to moderate tricompartmental osteoarthritis  XR KNEE 3 VIEW RIGHT  Result Date: 03/05/2019 Periarticular spurring.  Mild to moderate tricompartmental osteoarthritis    PMFS History: Patient Active Problem List   Diagnosis Date Noted  . Primary osteoarthritis of left knee 03/05/2019  . Chronic pain of both knees 02/27/2019  . OSA (obstructive sleep apnea) 07/04/2017  . Diabetes mellitus without complication ([place]) 123XX123  . Lipoma of skin and subcutaneous tissue of trunk 05/23/2014  . Eczema 05/23/2014  . MICROSCOPIC HEMATURIA 01/29/2010  . CONSTIPATION, CHRONIC 11/26/2007  . SKIN LESION 11/26/2007  . LEG CRAMPS, NOCTURNAL 11/26/2007  . HYPERLIPIDEMIA 10/27/2006  . LOW BACK PAIN 10/27/2006  . Depression, major, single episode, moderate ([place]) 10/20/2006  . GERD 10/20/2006  .  HEADACHE 10/20/2006   Past Medical History:  Diagnosis Date  . Anxiety   . Bronchial pneumonia    back in January  . Carpal tunnel syndrome, bilateral   . Depression   . Diabetes mellitus without complication ([place])   . Eczema    FRONT OF CHEST, MIDDLE OF BACK, FRONT OF LEGS  . GERD (gastroesophageal reflux disease)   . Glaucoma    sees Dr. Camille Bal at [facility].  . Heart murmur    as child   . Hyperlipidemia   . Lightheadedness   . Low back pain   . Sleep apnea    wears cpap     Family History  Problem Relation Age of Onset  . Diabetes Other   . Cancer Father   . Colon cancer Neg Hx   . Colon polyps Neg Hx   . Esophageal cancer Neg Hx   . Rectal cancer Neg Hx   . Stomach cancer Neg Hx     Past Surgical History:  Procedure Laterality Date  . COLONOSCOPY  03/19/2018   per Dr. Loletha Carrow, multiple adenomatous polyps,repeat in 3 yrs   . LIPOMA EXCISION N/A 12/01/2015   Procedure: EXCISION OF LIPOMA OF BACK;  Surgeon: Erroll Luna, MD;  Location: [facility];  Service: General;  Laterality: N/A;  . POLYPECTOMY     Social History   Occupational History  . Not on file  Tobacco Use  . Smoking status: Never Smoker  . Smokeless tobacco: Never Used  Substance and Sexual Activity  . Alcohol use: No    Alcohol/week: 0.0 standard drinks  . Drug use: No  . Sexual activity: Yes

## 2019-03-13 DIAGNOSIS — Z03818 Encounter for observation for suspected exposure to other biological agents ruled out: Secondary | ICD-10-CM | POA: Diagnosis not present

## 2019-03-13 DIAGNOSIS — Z20828 Contact with and (suspected) exposure to other viral communicable diseases: Secondary | ICD-10-CM | POA: Diagnosis not present

## 2019-04-24 ENCOUNTER — Ambulatory Visit: Payer: BC Managed Care – PPO | Attending: Internal Medicine

## 2019-04-24 DIAGNOSIS — Z20822 Contact with and (suspected) exposure to covid-19: Secondary | ICD-10-CM | POA: Diagnosis not present

## 2019-04-25 LAB — NOVEL CORONAVIRUS, NAA: SARS-CoV-2, NAA: NOT DETECTED

## 2019-04-25 LAB — SARS-COV-2, NAA 2 DAY TAT

## 2019-05-17 ENCOUNTER — Other Ambulatory Visit: Payer: Self-pay

## 2019-05-17 DIAGNOSIS — H401132 Primary open-angle glaucoma, bilateral, moderate stage: Secondary | ICD-10-CM | POA: Diagnosis not present

## 2019-05-20 ENCOUNTER — Encounter: Payer: Self-pay | Admitting: Neurology

## 2019-05-20 ENCOUNTER — Other Ambulatory Visit: Payer: Self-pay

## 2019-05-20 ENCOUNTER — Ambulatory Visit (INDEPENDENT_AMBULATORY_CARE_PROVIDER_SITE_OTHER): Payer: BC Managed Care – PPO | Admitting: Family Medicine

## 2019-05-20 ENCOUNTER — Encounter: Payer: Self-pay | Admitting: Family Medicine

## 2019-05-20 VITALS — BP 130/80 | HR 111 | Temp 98.2°F | Wt 270.8 lb

## 2019-05-20 DIAGNOSIS — R251 Tremor, unspecified: Secondary | ICD-10-CM

## 2019-05-20 NOTE — Progress Notes (Signed)
   Subjective:    Patient ID: Curtis Duran, male    DOB: 1955/01/21, 64 y.o.   MRN: [id number]  HPI Here for 6 months of tremors that started in the right hand and now also involve the left hand. No pain or numbness or weakness. He says his older brother developed tremors while in his 64's. He had complete lab work here in February which was unremarkable.    Review of Systems  Constitutional: Negative.   Respiratory: Negative.   Neurological: Positive for tremors. Negative for speech difficulty, weakness, numbness and headaches.       Objective:   Physical Exam Constitutional:      Appearance: Normal appearance.  Cardiovascular:     Rate and Rhythm: Normal rate and regular rhythm.     Pulses: Normal pulses.     Heart sounds: Normal heart sounds.  Pulmonary:     Effort: Pulmonary effort is normal.     Breath sounds: Normal breath sounds.  Neurological:     Mental Status: He is alert and oriented to person, place, and time.     Cranial Nerves: No cranial nerve deficit.     Motor: No weakness.     Gait: Gait normal.     Comments: There is a slight resting tremor in the right hand            Assessment & Plan:  Tremors, refer to Neurology. Alysia Penna, MD

## 2019-06-18 NOTE — Progress Notes (Signed)
Assessment/Plan:   1.  Tremor  -May represent very mild essential tremor.  Reassured the patient that he does not have Parkinson's disease.  Patient asked if this could be worsened by depression, and I told him that it could be.  He states that he is being treated for this and thought it could be contributing.  Discussed that stress certainly could worsen tremor, as could fluctuating blood sugars (patient diabetic).  Patient and I discussed medical/medication treatment, but ultimately he really does not want that right now.  He can let us know if he changes his mind.  He can also let us know if symptoms change in the future.  Otherwise, we will plan on seeing him back on an as-needed basis.  Subjective:   Curtis Duran was seen today in the movement disorders clinic for neurologic consultation at the request of Laurey Morale, MD.  The consultation is for the evaluation of tremor.  Outside records that were made available to me were reviewed.  Tremor: Yes.     How long has it been going on? Started in the right hand 6 months ago, and more recently developed in the left hand  At rest or with activation?  With use/activation  When is it noted the most?  Picking up objects  Fam hx of tremor?  Yes.  , Brother with tremor  Affected by caffeine:  unknown (doesn't drink caffeine)  Affected by alcohol:  Doesn't drink alcohol  Affected by stress:  No.  Affected by fatigue:  Yes.    Spills cereal if on spoon:  No.  Spills glass of liquid if full:  No.  Affects ADL's (tying shoes, brushing teeth, etc):  No.  Tremor inducing meds:  No.  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  Acting out dreams:  Some talking in sleep Wet Pillows: No. Postural symptoms:  Yes.    Falls?  No. Bradykinesia symptoms: difficulty getting out of a chair Loss of smell:  Yes.  , relates to having pneumonia in Jan and getting an  inhaler and hasn't been able to smell/taste since.  covid neg at time. Loss of taste:  Yes.   Urinary Incontinence:  No. Difficulty Swallowing:  Yes.  , relates to a posterior neck lipoma 2 years ago Handwriting, micrographia: No. Memory changes:  Yes.   N/V:  Yes.  , some AM nausea Lightheaded:  Yes.  , some about a month ago but better now  Syncope: No. Diplopia:  No. Dyskinesia:  No.  Neuroimaging of the brain has not previously been performed.   PREVIOUS MEDICATIONS: none to date  ALLERGIES:   Allergies  Allergen Reactions  . Citalopram Hydrobromide Rash    CURRENT MEDICATIONS:  Current Outpatient Medications  Medication Instructions  . brimonidine (ALPHAGAN) 0.15 % ophthalmic solution 1 drop, Both Eyes, 2 times daily  . buPROPion (WELLBUTRIN XL) 300 mg, Oral, Daily  . celecoxib (CELEBREX) 200 mg, Oral, 2 times daily  . diclofenac (VOLTAREN) 75 mg, Oral, 2 times daily  . dorzolamide-timolol (COSOPT) 22.3-6.8 MG/ML ophthalmic solution 1 drop, Both Eyes, Daily  . DULoxetine (CYMBALTA) 30 MG capsule TAKE 1 CAPSULE BY MOUTH EVERY DAY  . latanoprost (XALATAN) 0.005 % ophthalmic solution 1 drop, Both Eyes, Daily at bedtime  . metFORMIN (GLUCOPHAGE) 500 mg, Oral, 2 times daily with meals  . omeprazole (PRILOSEC) 40 MG capsule TAKE 1 CAPSULE BY  MOUTH TWICE A DAY  . PRESCRIPTION MEDICATION Rhopresso 2.5 mg 1 gtt both eyes daily   . promethazine-dextromethorphan (PROMETHAZINE-DM) 6.25-15 MG/5ML syrup promethazine-DM 6.25 mg-15 mg/5 mL oral syrup  TAKE 5MLS EVERY 6 HOURS BY ORAL ROUTE AS NEEDED FOR COUGH/NAUSEA  . RHOPRESSA 0.02 % SOLN 1 drop, Ophthalmic, Daily  . rosuvastatin (CRESTOR) 10 MG tablet TAKE 1 TABLET (10 MG TOTAL) BY MOUTH DAILY.    Objective:   PHYSICAL EXAMINATION:    VITALS:   Vitals:   06/20/19 0934  Weight: 270 lb 3.2 oz (122.6 kg)  Height: 6\' 4"  (1.93 m)    GEN:  The patient appears stated age and is in NAD. HEENT:  Normocephalic, atraumatic.  The  mucous membranes are moist. The superficial temporal arteries are without ropiness or tenderness. CV:  RRR Lungs:  CTAB Neck/HEME:  There are no carotid bruits bilaterally.  Neurological examination:  Orientation: The patient is alert and oriented x3.  Cranial nerves: There is good facial symmetry.  Extraocular muscles are intact. The visual fields are full to confrontational testing. The speech is fluent and clear. Soft palate rises symmetrically and there is no tongue deviation. Hearing is intact to conversational tone. Sensation: Sensation is intact to light touch throughout (facial, trunk, extremities). Vibration is intact at the bilateral big toe. There is no extinction with double simultaneous stimulation.  Motor: Strength is 5/5 in the bilateral upper and lower extremities.   Shoulder shrug is equal and symmetric.  There is no pronator drift. Deep tendon reflexes: Deep tendon reflexes are 2/4 at the bilateral biceps, triceps, brachioradialis, patella and achilles. Plantar responses are downgoing bilaterally.  Movement examination: Tone: There is normal tone in the bilateral upper extremities.  The tone in the lower extremities is normal.  Abnormal movements: No rest tremor.  Very mild postural tremor.  No trouble with Archimedes spirals.  No trouble pouring water from 1 glass to another. Coordination:  There is no decremation with RAM's Gait and Station: The patient has no difficulty arising out of a deep-seated chair without the use of the hands. The patient's stride length is good.   I have reviewed and interpreted the following labs independently   Chemistry      Component Value Date/Time   NA 137 02/27/2019 1049   K 4.2 02/27/2019 1049   CL 102 02/27/2019 1049   CO2 26 02/27/2019 1049   BUN 15 02/27/2019 1049   CREATININE 1.09 02/27/2019 1049      Component Value Date/Time   CALCIUM 9.5 02/27/2019 1049   ALKPHOS 45 02/27/2019 1049   AST 21 02/27/2019 1049   ALT 31  02/27/2019 1049   BILITOT 0.4 02/27/2019 1049      Lab Results  Component Value Date   TSH 1.53 02/27/2019   Lab Results  Component Value Date   WBC 7.9 02/27/2019   HGB 15.1 02/27/2019   HCT 45.9 02/27/2019   MCV 91.7 02/27/2019   PLT 272.0 02/27/2019      Total time spent on today's visit was 45 minutes, including both face-to-face time and nonface-to-face time.  Time included that spent on review of records (prior notes available to me/labs/imaging if pertinent), discussing treatment and goals, answering patient's questions and coordinating care.  Cc:  Laurey Morale, MD

## 2019-06-20 ENCOUNTER — Encounter: Payer: Self-pay | Admitting: Neurology

## 2019-06-20 ENCOUNTER — Ambulatory Visit: Payer: BC Managed Care – PPO | Admitting: Neurology

## 2019-06-20 ENCOUNTER — Other Ambulatory Visit: Payer: Self-pay

## 2019-06-20 VITALS — BP 165/92 | HR 104 | Ht 76.0 in | Wt 270.2 lb

## 2019-06-20 DIAGNOSIS — G250 Essential tremor: Secondary | ICD-10-CM

## 2019-06-20 DIAGNOSIS — F321 Major depressive disorder, single episode, moderate: Secondary | ICD-10-CM | POA: Diagnosis not present

## 2019-06-20 DIAGNOSIS — E119 Type 2 diabetes mellitus without complications: Secondary | ICD-10-CM | POA: Diagnosis not present

## 2019-06-20 NOTE — Patient Instructions (Signed)
We have decided to hold on any tremor medications for now.  You can let me know if you change your mind in the future.  The physicians and staff at [facility] are committed to providing excellent care. You may receive a survey requesting feedback about your experience at our office. We strive to receive "very good" responses to the survey questions. If you feel that your experience would prevent you from giving the office a "very good " response, please contact our office to try to remedy the situation. We may be reached at [phone]. Thank you for taking the time out of your busy day to complete the survey.

## 2019-06-29 ENCOUNTER — Other Ambulatory Visit: Payer: Self-pay | Admitting: Orthopaedic Surgery

## 2019-08-13 DIAGNOSIS — J209 Acute bronchitis, unspecified: Secondary | ICD-10-CM | POA: Diagnosis not present

## 2019-09-06 ENCOUNTER — Encounter: Payer: Self-pay | Admitting: Family Medicine

## 2019-09-06 DIAGNOSIS — E119 Type 2 diabetes mellitus without complications: Secondary | ICD-10-CM | POA: Diagnosis not present

## 2019-09-06 DIAGNOSIS — H401132 Primary open-angle glaucoma, bilateral, moderate stage: Secondary | ICD-10-CM | POA: Diagnosis not present

## 2019-09-06 DIAGNOSIS — H2513 Age-related nuclear cataract, bilateral: Secondary | ICD-10-CM | POA: Diagnosis not present

## 2019-09-06 LAB — HM DIABETES EYE EXAM

## 2019-09-15 ENCOUNTER — Other Ambulatory Visit: Payer: Self-pay | Admitting: Family Medicine

## 2019-10-11 ENCOUNTER — Other Ambulatory Visit: Payer: Self-pay

## 2019-10-11 ENCOUNTER — Ambulatory Visit (INDEPENDENT_AMBULATORY_CARE_PROVIDER_SITE_OTHER): Payer: BC Managed Care – PPO

## 2019-10-11 DIAGNOSIS — Z23 Encounter for immunization: Secondary | ICD-10-CM

## 2019-10-21 ENCOUNTER — Other Ambulatory Visit: Payer: Self-pay | Admitting: Orthopaedic Surgery

## 2019-10-22 NOTE — Telephone Encounter (Signed)
Just sent in

## 2019-11-06 IMAGING — DX DG CHEST 2V
2 series · 2 of 2 positions shown · non-contrast
Comparison: None

CLINICAL DATA: Productive cough for 1 month, diabetes mellitus,
history pneumonia

EXAM:
CHEST  2 VIEW

[chest pa]
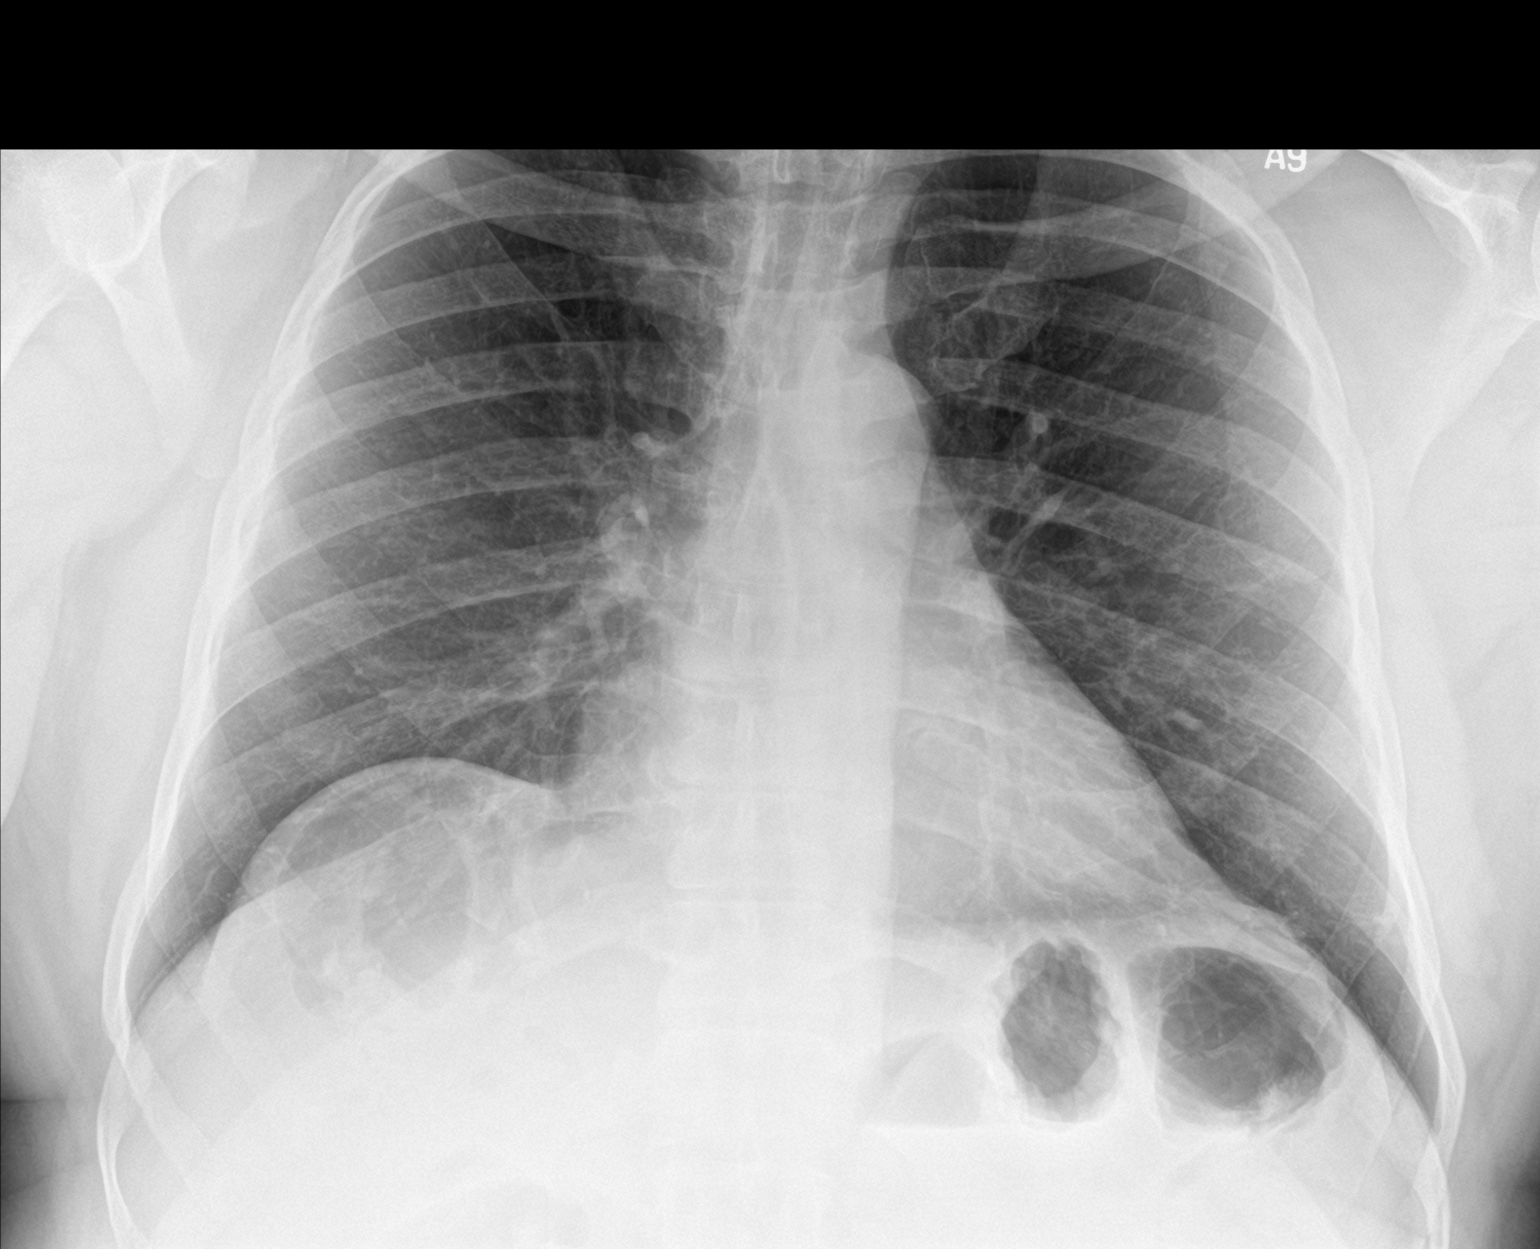

[chest lat]
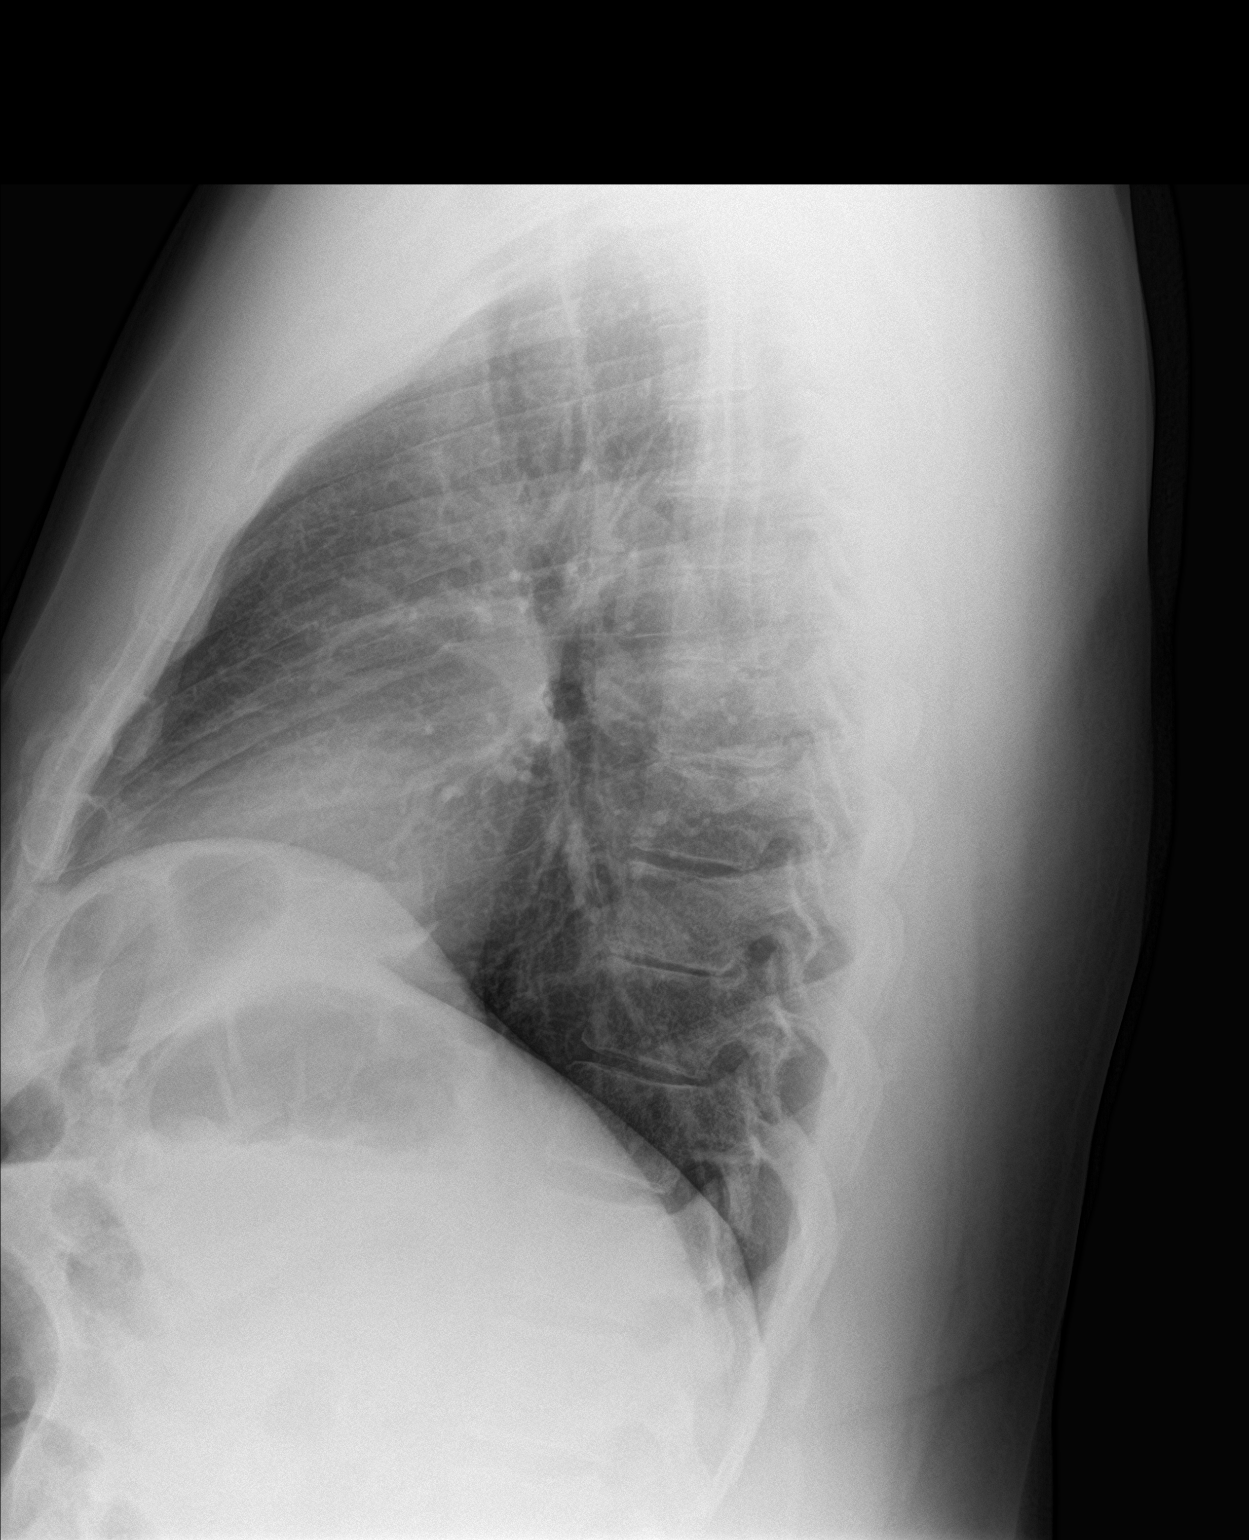

[2 of 2 positions shown; findings below may reference images not displayed]

FINDINGS: Normal heart size, mediastinal contours, and pulmonary vascularity.

Lungs clear.

No pulmonary infiltrate, pleural effusion or pneumothorax.

Bowel interposition between liver and diaphragm.
IMPRESSION: No acute abnormalities.

## 2019-12-31 DIAGNOSIS — H401131 Primary open-angle glaucoma, bilateral, mild stage: Secondary | ICD-10-CM | POA: Diagnosis not present

## 2020-01-15 ENCOUNTER — Emergency Department (HOSPITAL_COMMUNITY)
Admission: EM | Admit: 2020-01-15 | Discharge: 2020-01-16 | Disposition: A | Discharge status: Home / Self Care | Payer: BC Managed Care – PPO | Attending: Emergency Medicine | Admitting: Emergency Medicine

## 2020-01-15 ENCOUNTER — Other Ambulatory Visit: Payer: Self-pay

## 2020-01-15 ENCOUNTER — Emergency Department (HOSPITAL_COMMUNITY): Payer: BC Managed Care – PPO

## 2020-01-15 ENCOUNTER — Encounter (HOSPITAL_COMMUNITY): Payer: Self-pay | Admitting: *Deleted

## 2020-01-15 DIAGNOSIS — M25512 Pain in left shoulder: Secondary | ICD-10-CM | POA: Diagnosis not present

## 2020-01-15 DIAGNOSIS — R0602 Shortness of breath: Secondary | ICD-10-CM | POA: Diagnosis not present

## 2020-01-15 DIAGNOSIS — Z5321 Procedure and treatment not carried out due to patient leaving prior to being seen by health care provider: Secondary | ICD-10-CM | POA: Diagnosis not present

## 2020-01-15 LAB — CBC
HCT: 47.8 % (ref 39.0–52.0)
Hemoglobin: 15.1 g/dL (ref 13.0–17.0)
MCH: 29.6 pg (ref 26.0–34.0)
MCHC: 31.6 g/dL (ref 30.0–36.0)
MCV: 93.7 fL (ref 80.0–100.0)
Platelets: 301 10*3/uL (ref 150–400)
RBC: 5.1 MIL/uL (ref 4.22–5.81)
RDW: 12.9 % (ref 11.5–15.5)
WBC: 7.2 10*3/uL (ref 4.0–10.5)
nRBC: 0 % (ref 0.0–0.2)

## 2020-01-15 LAB — BASIC METABOLIC PANEL
Anion gap: 11 (ref 5–15)
BUN: 12 mg/dL (ref 8–23)
CO2: 22 mmol/L (ref 22–32)
Calcium: 9.7 mg/dL (ref 8.9–10.3)
Chloride: 105 mmol/L (ref 98–111)
Creatinine, Ser: 1.13 mg/dL (ref 0.61–1.24)
GFR, Estimated: >60 mL/min (ref >60)
Glucose, Bld: 121 mg/dL — ABNORMAL HIGH (ref 70–99)
Potassium: 4.1 mmol/L (ref 3.5–5.1)
Sodium: 138 mmol/L (ref 135–145)

## 2020-01-15 LAB — TROPONIN I (HIGH SENSITIVITY)
Troponin I (High Sensitivity): 3 ng/L (ref <18)
Troponin I (High Sensitivity): 2 ng/L (ref <18)

## 2020-01-15 NOTE — ED Triage Notes (Signed)
The pt has had  The vaccines and a booster

## 2020-01-15 NOTE — ED Triage Notes (Signed)
The pt is c/o lt shoulder pain for 2 months  He thinks its a torn rotator cuff tear  No known injury

## 2020-01-15 NOTE — ED Triage Notes (Signed)
The pt reports that the pain is worse when he sleedps on it at night  He also reports that he has had some sob also

## 2020-01-22 ENCOUNTER — Ambulatory Visit: Payer: BC Managed Care – PPO | Admitting: Family Medicine

## 2020-02-11 ENCOUNTER — Other Ambulatory Visit: Payer: Self-pay | Admitting: Family Medicine

## 2020-02-11 ENCOUNTER — Other Ambulatory Visit: Payer: Self-pay | Admitting: Physician Assistant

## 2020-02-28 ENCOUNTER — Encounter: Payer: BC Managed Care – PPO | Admitting: Family Medicine

## 2020-03-03 ENCOUNTER — Other Ambulatory Visit: Payer: Self-pay

## 2020-03-04 ENCOUNTER — Ambulatory Visit (INDEPENDENT_AMBULATORY_CARE_PROVIDER_SITE_OTHER): Payer: BC Managed Care – PPO | Admitting: Family Medicine

## 2020-03-04 ENCOUNTER — Encounter: Payer: Self-pay | Admitting: Family Medicine

## 2020-03-04 VITALS — BP 130/94 | HR 103 | Temp 98.8°F | Ht 75.98 in | Wt 274.6 lb

## 2020-03-04 DIAGNOSIS — Z0000 Encounter for general adult medical examination without abnormal findings: Secondary | ICD-10-CM

## 2020-03-04 LAB — HEPATIC FUNCTION PANEL
ALT: 23 U/L (ref 0–53)
AST: 17 U/L (ref 0–37)
Albumin: 4.4 g/dL (ref 3.5–5.2)
Alkaline Phosphatase: 43 U/L (ref 39–117)
Bilirubin, Direct: 0.1 mg/dL (ref 0.0–0.3)
Total Bilirubin: 0.4 mg/dL (ref 0.2–1.2)
Total Protein: 6.9 g/dL (ref 6.0–8.3)

## 2020-03-04 LAB — BASIC METABOLIC PANEL
BUN: 12 mg/dL (ref 6–23)
CO2: 26 mEq/L (ref 19–32)
Calcium: 9.7 mg/dL (ref 8.4–10.5)
Chloride: 102 mEq/L (ref 96–112)
Creatinine, Ser: 1.13 mg/dL (ref 0.40–1.50)
GFR: 67.92 mL/min (ref >60.00)
Glucose, Bld: 98 mg/dL (ref 70–99)
Potassium: 4.4 mEq/L (ref 3.5–5.1)
Sodium: 139 mEq/L (ref 135–145)

## 2020-03-04 LAB — CBC WITH DIFFERENTIAL/PLATELET
Basophils Absolute: 0.1 10*3/uL (ref 0.0–0.1)
Basophils Relative: 1 % (ref 0.0–3.0)
Eosinophils Absolute: 0.2 10*3/uL (ref 0.0–0.7)
Eosinophils Relative: 2.5 % (ref 0.0–5.0)
HCT: 46.1 % (ref 39.0–52.0)
Hemoglobin: 15.4 g/dL (ref 13.0–17.0)
Lymphocytes Relative: 27.2 % (ref 12.0–46.0)
Lymphs Abs: 2.1 10*3/uL (ref 0.7–4.0)
MCHC: 33.4 g/dL (ref 30.0–36.0)
MCV: 89.4 fl (ref 78.0–100.0)
Monocytes Absolute: 0.8 10*3/uL (ref 0.1–1.0)
Monocytes Relative: 9.8 % (ref 3.0–12.0)
Neutro Abs: 4.7 10*3/uL (ref 1.4–7.7)
Neutrophils Relative %: 59.5 % (ref 43.0–77.0)
Platelets: 272 10*3/uL (ref 150.0–400.0)
RBC: 5.16 Mil/uL (ref 4.22–5.81)
RDW: 14.2 % (ref 11.5–15.5)
WBC: 7.9 10*3/uL (ref 4.0–10.5)

## 2020-03-04 LAB — HEMOGLOBIN A1C: Hgb A1c MFr Bld: 6.3 % (ref 4.6–6.5)

## 2020-03-04 LAB — LIPID PANEL
Cholesterol: 143 mg/dL (ref 0–200)
HDL: 63.4 mg/dL (ref >39.00)
LDL Cholesterol: 59 mg/dL (ref 0–99)
NonHDL: 79.98
Total CHOL/HDL Ratio: 2
Triglycerides: 104 mg/dL (ref 0.0–149.0)
VLDL: 20.8 mg/dL (ref 0.0–40.0)

## 2020-03-04 LAB — PSA: PSA: 4.43 ng/mL — ABNORMAL HIGH (ref 0.10–4.00)

## 2020-03-04 LAB — TSH: TSH: 1.66 u[IU]/mL (ref 0.35–4.50)

## 2020-03-04 MED ORDER — BUPROPION HCL ER (XL) 300 MG PO TB24: 300.0000 mg | ORAL_TABLET | Freq: Every day | ORAL | 3 refills | Status: DC

## 2020-03-04 MED ORDER — CELECOXIB 200 MG PO CAPS: 200.0000 mg | ORAL_CAPSULE | Freq: Two times a day (BID) | ORAL | 3 refills | Status: DC

## 2020-03-04 NOTE — Progress Notes (Signed)
Subjective:    Patient ID: Curtis Duran, male    DOB: 12/12/1955, 65 y.o.   MRN: [id number]  HPI Here for a well exam. He is doing well.    Review of Systems  Constitutional: Negative.   HENT: Negative.   Eyes: Negative.   Respiratory: Negative.   Cardiovascular: Negative.   Gastrointestinal: Negative.   Genitourinary: Negative.   Musculoskeletal: Negative.   Skin: Negative.   Neurological: Negative.   Psychiatric/Behavioral: Negative.        Objective:   Physical Exam Constitutional:      General: He is not in acute distress.    Appearance: He is well-developed and well-nourished. He is obese. He is not diaphoretic.  HENT:     Head: Normocephalic and atraumatic.     Right Ear: External ear normal.     Left Ear: External ear normal.     Nose: Nose normal.     Mouth/Throat:     Mouth: Oropharynx is clear and moist.     Pharynx: No oropharyngeal exudate.  Eyes:     General: No scleral icterus.       Right eye: No discharge.        Left eye: No discharge.     Extraocular Movements: EOM normal.     Conjunctiva/sclera: Conjunctivae normal.     Pupils: Pupils are equal, round, and reactive to light.  Neck:     Thyroid: No thyromegaly.     Vascular: No JVD.     Trachea: No tracheal deviation.  Cardiovascular:     Rate and Rhythm: Normal rate and regular rhythm.     Pulses: Intact distal pulses.     Heart sounds: Normal heart sounds. No murmur heard. No friction rub. No gallop.   Pulmonary:     Effort: Pulmonary effort is normal. No respiratory distress.     Breath sounds: Normal breath sounds. No wheezing or rales.  Chest:     Chest wall: No tenderness.  Abdominal:     General: Bowel sounds are normal. There is no distension.     Palpations: Abdomen is soft. There is no mass.     Tenderness: There is no abdominal tenderness. There is no guarding or rebound.  Genitourinary:    Penis: Normal. No tenderness.      Testes: Normal.     Prostate: Normal.      Rectum: Normal. Guaiac result negative.  Musculoskeletal:        General: No tenderness or edema. Normal range of motion.     Cervical back: Neck supple.  Lymphadenopathy:     Cervical: No cervical adenopathy.  Skin:    General: Skin is warm and dry.     Coloration: Skin is not pale.     Findings: No erythema or rash.  Neurological:     Mental Status: He is alert and oriented to person, place, and time.     Cranial Nerves: No cranial nerve deficit.     Motor: No abnormal muscle tone.     Coordination: Coordination normal.     Deep Tendon Reflexes: Reflexes are normal and symmetric. Reflexes normal.  Psychiatric:        Mood and Affect: Mood and affect normal.        Behavior: Behavior normal.        Thought Content: Thought content normal.        Judgment: Judgment normal.           Assessment & Plan:  Well exam. We discussed diet and exercise. Get fasting labs. Alysia Penna, MD

## 2020-03-10 ENCOUNTER — Telehealth: Payer: Self-pay | Admitting: Family Medicine

## 2020-03-10 NOTE — Telephone Encounter (Signed)
Patient returned [facility] call for lab results

## 2020-04-28 DIAGNOSIS — H401131 Primary open-angle glaucoma, bilateral, mild stage: Secondary | ICD-10-CM | POA: Diagnosis not present

## 2020-06-02 ENCOUNTER — Other Ambulatory Visit: Payer: Self-pay | Admitting: Family Medicine

## 2020-07-09 ENCOUNTER — Other Ambulatory Visit (INDEPENDENT_AMBULATORY_CARE_PROVIDER_SITE_OTHER): Payer: BC Managed Care – PPO

## 2020-07-09 ENCOUNTER — Other Ambulatory Visit: Payer: Self-pay

## 2020-07-09 ENCOUNTER — Other Ambulatory Visit: Payer: Self-pay | Admitting: Family Medicine

## 2020-07-09 DIAGNOSIS — R972 Elevated prostate specific antigen [PSA]: Secondary | ICD-10-CM | POA: Diagnosis not present

## 2020-07-09 LAB — PSA: PSA: 3.08 ng/mL (ref 0.10–4.00)

## 2020-08-05 ENCOUNTER — Ambulatory Visit: Payer: BC Managed Care – PPO | Admitting: Family Medicine

## 2020-08-05 ENCOUNTER — Encounter: Payer: Self-pay | Admitting: Family Medicine

## 2020-08-05 ENCOUNTER — Other Ambulatory Visit: Payer: Self-pay

## 2020-08-05 VITALS — BP 126/60 | HR 112 | Temp 98.6°F | Ht 75.0 in | Wt 270.6 lb

## 2020-08-05 DIAGNOSIS — M940 Chondrocostal junction syndrome [Tietze]: Secondary | ICD-10-CM

## 2020-08-05 MED ORDER — METHYLPREDNISOLONE ACETATE 40 MG/ML IJ SUSP
40.0000 mg | Freq: Once | INTRAMUSCULAR | Status: AC
  Administered 2020-08-05: 40 mg via INTRAMUSCULAR

## 2020-08-05 MED ORDER — METHYLPREDNISOLONE 4 MG PO TBPK: ORAL_TABLET | ORAL | 0 refills | Status: DC

## 2020-08-05 MED ORDER — METHYLPREDNISOLONE ACETATE 80 MG/ML IJ SUSP
80.0000 mg | Freq: Once | INTRAMUSCULAR | Status: AC
  Administered 2020-08-05: 80 mg via INTRAMUSCULAR

## 2020-08-05 NOTE — Progress Notes (Signed)
   Subjective:    Patient ID: Curtis Duran, male    DOB: 11/16/1955, 65 y.o.   MRN: [id number]  HPI Here for mild SOB for the past 4 weeks and sharp chest pains for the past week. These are in the right and left upper chest areas. The pains are uncomfortable but not severe. It hurts to take a deep breath or to twist his trunk. He has a mild dry cough off and on. No fever.    Review of Systems  Constitutional: Negative.   HENT: Negative.    Respiratory:  Positive for cough and shortness of breath. Negative for chest tightness and wheezing.   Cardiovascular:  Positive for chest pain. Negative for palpitations and leg swelling.      Objective:   Physical Exam Constitutional:      General: He is not in acute distress.    Appearance: Normal appearance. He is well-developed.  Cardiovascular:     Rate and Rhythm: Normal rate and regular rhythm.     Pulses: Normal pulses.     Heart sounds: Normal heart sounds.  Pulmonary:     Effort: Pulmonary effort is normal. No respiratory distress.     Breath sounds: Normal breath sounds. No stridor. No wheezing, rhonchi or rales.     Comments: He is tender in the left and right upper chest areas, especially over the right and left sternal margins. Palpating these areas reproduces the pain  Lymphadenopathy:     Cervical: No cervical adenopathy.  Neurological:     Mental Status: He is alert.          Assessment & Plan:  Costochondritis, treat with a DepoMedrol shot today. He will also begin a Medrol dose pack tomorrow. Follow up as needed.  Alysia Penna, MD

## 2020-08-30 ENCOUNTER — Other Ambulatory Visit: Payer: Self-pay | Admitting: Family Medicine

## 2020-09-04 DIAGNOSIS — H401131 Primary open-angle glaucoma, bilateral, mild stage: Secondary | ICD-10-CM | POA: Diagnosis not present

## 2020-11-03 ENCOUNTER — Other Ambulatory Visit: Payer: Self-pay

## 2020-11-03 ENCOUNTER — Ambulatory Visit (INDEPENDENT_AMBULATORY_CARE_PROVIDER_SITE_OTHER): Payer: BC Managed Care – PPO

## 2020-11-03 DIAGNOSIS — Z23 Encounter for immunization: Secondary | ICD-10-CM | POA: Diagnosis not present

## 2020-11-14 DIAGNOSIS — R030 Elevated blood-pressure reading, without diagnosis of hypertension: Secondary | ICD-10-CM | POA: Diagnosis not present

## 2020-11-14 DIAGNOSIS — G4701 Insomnia due to medical condition: Secondary | ICD-10-CM | POA: Diagnosis not present

## 2020-11-14 DIAGNOSIS — J0100 Acute maxillary sinusitis, unspecified: Secondary | ICD-10-CM | POA: Diagnosis not present

## 2020-11-14 DIAGNOSIS — G4730 Sleep apnea, unspecified: Secondary | ICD-10-CM | POA: Diagnosis not present

## 2020-11-21 DIAGNOSIS — G4730 Sleep apnea, unspecified: Secondary | ICD-10-CM | POA: Diagnosis not present

## 2020-11-21 DIAGNOSIS — I10 Essential (primary) hypertension: Secondary | ICD-10-CM | POA: Diagnosis not present

## 2020-11-28 ENCOUNTER — Other Ambulatory Visit: Payer: Self-pay | Admitting: Family Medicine

## 2020-12-24 DIAGNOSIS — H524 Presbyopia: Secondary | ICD-10-CM | POA: Diagnosis not present

## 2020-12-24 DIAGNOSIS — H401131 Primary open-angle glaucoma, bilateral, mild stage: Secondary | ICD-10-CM | POA: Diagnosis not present

## 2020-12-24 DIAGNOSIS — E119 Type 2 diabetes mellitus without complications: Secondary | ICD-10-CM | POA: Diagnosis not present

## 2020-12-24 LAB — HM DIABETES EYE EXAM

## 2020-12-26 ENCOUNTER — Encounter (HOSPITAL_COMMUNITY): Payer: Self-pay

## 2020-12-26 ENCOUNTER — Other Ambulatory Visit: Payer: Self-pay

## 2020-12-26 ENCOUNTER — Ambulatory Visit (HOSPITAL_COMMUNITY)
Admission: EM | Admit: 2020-12-26 | Discharge: 2020-12-26 | Disposition: A | Discharge status: Home / Self Care | Payer: BC Managed Care – PPO | Attending: Emergency Medicine | Admitting: Emergency Medicine

## 2020-12-26 DIAGNOSIS — J320 Chronic maxillary sinusitis: Secondary | ICD-10-CM | POA: Diagnosis not present

## 2020-12-26 MED ORDER — AMOXICILLIN-POT CLAVULANATE 875-125 MG PO TABS: 1.0000 | ORAL_TABLET | Freq: Two times a day (BID) | ORAL | 0 refills | Status: DC

## 2020-12-26 MED ORDER — FLUTICASONE PROPIONATE 50 MCG/ACT NA SUSP
1.0000 | Freq: Two times a day (BID) | NASAL | 12 refills | Status: DC
Start: 2020-12-26 — End: 2021-07-23

## 2020-12-26 MED ORDER — CETIRIZINE HCL 10 MG PO TABS: 10.0000 mg | ORAL_TABLET | Freq: Every day | ORAL | 12 refills | Status: DC

## 2020-12-26 NOTE — ED Provider Notes (Signed)
[facility]  ____________________________________________  Time seen: Approximately 11:38 AM  I have reviewed the triage vital signs and the nursing notes.   HISTORY  Chief Complaint Cough and Nasal Congestion    HPI Curtis Duran is a 65 y.o. male who presents to the urgent care complaining of sinus pressure, sinus congestion.  He states that the postnasal drainage is causing a cough but this is secondary to his congestion.  He has had 3 sinus infections in the last year.  No history of allergic rhinitis according to the patient but it does appear that patient has daily congestion and postnasal drainage.  He is not on any daily medications for same.  No fevers or chills, ear pain, sore throat, chest pain, shortness of breath, GI symptoms.       Past Medical History:  Diagnosis Date   Anxiety    Bronchial pneumonia    back in January   Carpal tunnel syndrome, bilateral    Depression    Diabetes mellitus without complication ([place])    Eczema    FRONT OF CHEST, MIDDLE OF BACK, FRONT OF LEGS   GERD (gastroesophageal reflux disease)    Glaucoma    sees Dr. Camille Bal at [facility]   Heart murmur    as child    Hyperlipidemia    Lightheadedness    Low back pain    Sleep apnea    wears cpap     Patient Active Problem List   Diagnosis Date Noted   Primary osteoarthritis of left knee 03/05/2019   Chronic pain of both knees 02/27/2019   OSA (obstructive sleep apnea) 07/04/2017   Diabetes mellitus without complication ([place]) 20/25/4270   Lipoma of skin and subcutaneous tissue of trunk 05/23/2014   Eczema 05/23/2014   MICROSCOPIC HEMATURIA 01/29/2010   CONSTIPATION, CHRONIC 11/26/2007   SKIN LESION 11/26/2007   LEG CRAMPS, NOCTURNAL 11/26/2007   HYPERLIPIDEMIA 10/27/2006   LOW BACK PAIN 10/27/2006   Depression, major, single episode, moderate ([place]) 10/20/2006   GERD 10/20/2006   HEADACHE 10/20/2006    Past Surgical History:  Procedure Laterality Date    COLONOSCOPY  03/19/2018   per Dr. Loletha Carrow, multiple adenomatous polyps,repeat in 3 yrs    LIPOMA EXCISION N/A 12/01/2015   Procedure: EXCISION OF LIPOMA OF BACK;  Surgeon: Erroll Luna, MD;  Location: [facility];  Service: General;  Laterality: N/A;   POLYPECTOMY      Prior to Admission medications   Medication Sig Start Date End Date Taking? Authorizing Provider  amoxicillin-clavulanate (AUGMENTIN) 875-125 MG tablet Take 1 tablet by mouth every 12 (twelve) hours. 12/26/20  Yes [place] Marchiano, Charline Bills, PA-C  buPROPion (WELLBUTRIN XL) 300 MG 24 hr tablet Take 1 tablet (300 mg total) by mouth daily. 03/04/20  Yes Laurey Morale, MD  celecoxib (CELEBREX) 200 MG capsule Take 1 capsule (200 mg total) by mouth 2 (two) times daily. 03/04/20  Yes Laurey Morale, MD  cetirizine (ZYRTEC) 10 MG tablet Take 1 tablet (10 mg total) by mouth daily. 12/26/20  Yes Sharley Keeler, Charline Bills, PA-C  dorzolamide-timolol (COSOPT) 22.3-6.8 MG/ML ophthalmic solution Place 1 drop into both eyes daily. Patient taking differently: Place 1 drop into both eyes 2 (two) times daily. 01/30/15  Yes Laurey Morale, MD  DULoxetine (CYMBALTA) 30 MG capsule TAKE 1 CAPSULE BY MOUTH EVERY DAY 11/30/20  Yes Laurey Morale, MD  fluticasone [facility]) 50 MCG/ACT nasal spray Place 1 spray into both nostrils 2 (two) times daily. 12/26/20  Yes Catelyn Friel, Charline Bills, PA-C  latanoprost (XALATAN) 0.005 % ophthalmic solution Place 1 drop into both eyes at bedtime. 10/21/15  Yes [provider]  metFORMIN (GLUCOPHAGE) 500 MG tablet TAKE 1 TABLET (500 MG TOTAL) BY MOUTH 2 (TWO) TIMES DAILY WITH A MEAL. 02/11/20  Yes Laurey Morale, MD  omeprazole (PRILOSEC) 40 MG capsule TAKE 1 CAPSULE BY MOUTH TWICE A DAY 02/11/20  Yes Laurey Morale, MD  PRESCRIPTION MEDICATION Rhopresso 2.5 mg 1 gtt both eyes daily   Yes [provider]  RHOPRESSA 0.02 % SOLN Apply 1 drop to eye daily. 10/17/18  Yes [provider]  rosuvastatin (CRESTOR) 10 MG  tablet TAKE 1 TABLET BY MOUTH EVERY DAY 02/11/20  Yes Laurey Morale, MD  brimonidine (ALPHAGAN) 0.15 % ophthalmic solution Place 1 drop into both eyes 2 (two) times daily.    [provider]  methylPREDNISolone (MEDROL DOSEPAK) 4 MG TBPK tablet As directed 08/05/20   Laurey Morale, MD  promethazine-dextromethorphan (PROMETHAZINE-DM) 6.25-15 MG/5ML syrup promethazine-DM 6.25 mg-15 mg/5 mL oral syrup  TAKE 5MLS EVERY 6 HOURS BY ORAL ROUTE AS NEEDED FOR COUGH/NAUSEA    [provider]    Allergies Citalopram hydrobromide  Family History  Problem Relation Age of Onset   Diabetes Other    Cancer Father    Colon cancer Neg Hx    Colon polyps Neg Hx    Esophageal cancer Neg Hx    Rectal cancer Neg Hx    Stomach cancer Neg Hx     Social History Social History   Tobacco Use   Smoking status: Never   Smokeless tobacco: Never  Vaping Use   Vaping Use: Never used  Substance Use Topics   Alcohol use: No    Alcohol/week: 0.0 standard drinks   Drug use: No     Review of Systems  Constitutional: No fever/chills Eyes: No visual changes. No discharge ENT: Positive for nasal congestion, postnasal drip Cardiovascular: no chest pain. Respiratory: Positive cough. No SOB. Gastrointestinal: No abdominal pain.  No nausea, no vomiting.  No diarrhea.  No constipation. Musculoskeletal: Negative for musculoskeletal pain. Skin: Negative for rash, abrasions, lacerations, ecchymosis. Neurological: Negative for headaches, focal weakness or numbness.  10 System ROS otherwise negative.  ____________________________________________   PHYSICAL EXAM:  VITAL SIGNS: ED Triage Vitals  Enc Vitals Group     BP 12/26/20 1122 (!) 161/94     Pulse Rate 12/26/20 1122 (!) 113     Resp 12/26/20 1122 18     Temp 12/26/20 1122 98.6 F (37 C)     Temp Source 12/26/20 1122 Oral     SpO2 12/26/20 1122 100 %     Weight 12/26/20 1119 272 lb (123.4 kg)     Height 12/26/20 1119 6\' 4"  (1.93  m)     Head Circumference --      Peak Flow --      Pain Score 12/26/20 1119 0     Pain Loc --      Pain Edu? --      Excl. in [place]? --      Constitutional: Alert and oriented. Well appearing and in no acute distress. Eyes: Conjunctivae are normal. PERRL. EOMI. Head: Atraumatic. ENT:      Ears: EACs and TMs unremarkable bilaterally.      Nose: Mild congestion/rhinnorhea.  Turbinates are erythematous and edematous and tender to percussion over the maxillary sinuses.      Mouth/Throat: Mucous membranes are moist.  Neck: No stridor.   Hematological/Lymphatic/Immunilogical: No cervical lymphadenopathy. Cardiovascular: Normal rate, regular rhythm. Normal S1 and S2.  Good peripheral circulation. Respiratory: Normal respiratory effort without tachypnea or retractions. Lungs CTAB. Good air entry to the bases with no decreased or absent breath sounds. Musculoskeletal: Full range of motion to all extremities. No gross deformities appreciated. Neurologic:  Normal speech and language. No gross focal neurologic deficits are appreciated.  Skin:  Skin is warm, dry and intact. No rash noted. Psychiatric: Mood and affect are normal. Speech and behavior are normal. Patient exhibits appropriate insight and judgement.   ____________________________________________   LABS (all labs ordered are listed, but only abnormal results are displayed)  Labs Reviewed - No data to display ____________________________________________  EKG   ____________________________________________  RADIOLOGY   No results found.  ____________________________________________    PROCEDURES  Procedure(s) performed:    Procedures    Medications - No data to display   ____________________________________________   INITIAL IMPRESSION / ASSESSMENT AND PLAN / ED COURSE  Pertinent labs & imaging results that were available during my care of the patient were reviewed by me and considered in my medical decision  making (see chart for details).  Review of the [place] CSRS was performed in accordance of the [place] prior to dispensing any controlled drugs.           Patient's diagnosis is consistent with sinusitis.  Patient presents to the emergency department with congestion, sinus pressure, postnasal drip.  Patient does have findings consistent with sinusitis and I will prescribe Flonase, Zyrtec and Augmentin.  It appears that patient likely has a component of chronic allergic rhinitis.  I will inform the patient to continue Flonase and Zyrtec after today symptoms have resolved.  Follow-up primary care as needed.  Return precautions discussed with the patient. Patient is given ED precautions to return to the ED for any worsening or new symptoms.     ____________________________________________  FINAL CLINICAL IMPRESSION(S) / DIAGNOSES  Final diagnoses:  Chronic maxillary sinusitis      NEW MEDICATIONS STARTED DURING THIS VISIT:  ED Discharge Orders          Ordered    fluticasone (FLONASE) 50 MCG/ACT nasal spray  2 times daily        12/26/20 1154    cetirizine (ZYRTEC) 10 MG tablet  Daily        12/26/20 1154    amoxicillin-clavulanate (AUGMENTIN) 875-125 MG tablet  Every 12 hours        12/26/20 1154                This chart was dictated using voice recognition software/Dragon. Despite best efforts to proofread, errors can occur which can change the meaning. Any change was purely unintentional.    Darletta Moll, PA-C 12/26/20 1158

## 2020-12-26 NOTE — ED Triage Notes (Signed)
Pt c/o cough and nasal congsetion x8days. Pt states that he has had a sinus infection but the drainage has caused a cough.

## 2020-12-29 ENCOUNTER — Encounter: Payer: Self-pay | Admitting: Family Medicine

## 2020-12-29 ENCOUNTER — Ambulatory Visit: Payer: BC Managed Care – PPO | Admitting: Family Medicine

## 2020-12-29 VITALS — BP 128/94 | HR 56 | Temp 99.3°F | Wt 274.0 lb

## 2020-12-29 DIAGNOSIS — J019 Acute sinusitis, unspecified: Secondary | ICD-10-CM | POA: Diagnosis not present

## 2020-12-29 DIAGNOSIS — I499 Cardiac arrhythmia, unspecified: Secondary | ICD-10-CM | POA: Diagnosis not present

## 2020-12-29 DIAGNOSIS — I10 Essential (primary) hypertension: Secondary | ICD-10-CM | POA: Diagnosis not present

## 2020-12-29 MED ORDER — AMLODIPINE BESYLATE 5 MG PO TABS: ORAL_TABLET | ORAL | 2 refills | Status: DC

## 2020-12-29 MED ORDER — LOSARTAN POTASSIUM-HCTZ 50-12.5 MG PO TABS: 1.0000 | ORAL_TABLET | Freq: Every day | ORAL | 2 refills | Status: DC

## 2020-12-29 NOTE — Progress Notes (Signed)
° °  Subjective:    Patient ID: Curtis Duran, male    DOB: [birth year], 65 y.o.   MRN: [id number]  HPI Here to follow from urgent care for high BP and irregular heartbeats. He was seen in one urgent care on 11-14-20 and on 11-21-20 for a sinus infection. He was treated with Augmentin and he improved a little. His BP was high on each of these visits (151/109 and 150/93). Then his sinus symptoms returned and he was seen at [facility] urgent care on 12-26-20. At that time he was given another round of Augmentin , and now he is feeling better. He still has sinus congestion and a dry cough. Np chest pain or SOB or fever. His BP that day was 161/94 with a pulse rate of 113. He was started on Amlodipine 5 mg daily on the 11-21-20 urgent care visit.    Review of Systems  Constitutional: Negative.   HENT:  Positive for congestion, postnasal drip and sinus pressure. Negative for sore throat.   Respiratory:  Positive for cough. Negative for chest tightness, shortness of breath and wheezing.   Cardiovascular: Negative.   Gastrointestinal: Negative.       Objective:   Physical Exam Constitutional:      Appearance: Normal appearance. He is not ill-appearing.  HENT:     Right Ear: Tympanic membrane, ear canal and external ear normal.     Left Ear: Tympanic membrane, ear canal and external ear normal.     Nose: Nose normal.     Mouth/Throat:     Pharynx: Oropharynx is clear.  Eyes:     Conjunctiva/sclera: Conjunctivae normal.  Cardiovascular:     Rate and Rhythm: Normal rate. Rhythm irregular.     Pulses: Normal pulses.     Heart sounds: Normal heart sounds.     Comments: EKG today shows sinus rhythm with frequent PAC's  Pulmonary:     Effort: Pulmonary effort is normal.     Breath sounds: Normal breath sounds.  Lymphadenopathy:     Cervical: No cervical adenopathy.  Neurological:     Mental Status: He is alert.           Assessment & Plan:  His sinusitis seems to be improving on the second  round of Augmentin, so he will finish this out. He has HTN and we will keep him on Amlodipine 5 mg daily. We will add Losartan HCT 50-12.5 daily. Recheck in 3-4 weeks. We spent a total of ( 33  ) minutes reviewing records and discussing these issues.  Alysia Penna, MD

## 2021-01-26 ENCOUNTER — Other Ambulatory Visit: Payer: Self-pay | Admitting: Family Medicine

## 2021-02-07 ENCOUNTER — Other Ambulatory Visit: Payer: Self-pay | Admitting: Family Medicine

## 2021-02-23 ENCOUNTER — Other Ambulatory Visit: Payer: Self-pay | Admitting: Family Medicine

## 2021-03-09 ENCOUNTER — Ambulatory Visit (INDEPENDENT_AMBULATORY_CARE_PROVIDER_SITE_OTHER): Payer: BC Managed Care – PPO | Admitting: Family Medicine

## 2021-03-09 ENCOUNTER — Encounter: Payer: Self-pay | Admitting: Family Medicine

## 2021-03-09 VITALS — BP 128/88 | HR 103 | Ht 76.0 in | Wt 277.0 lb

## 2021-03-09 DIAGNOSIS — Z86010 Personal history of colonic polyps: Secondary | ICD-10-CM | POA: Diagnosis not present

## 2021-03-09 DIAGNOSIS — Z0000 Encounter for general adult medical examination without abnormal findings: Secondary | ICD-10-CM

## 2021-03-09 DIAGNOSIS — Z125 Encounter for screening for malignant neoplasm of prostate: Secondary | ICD-10-CM | POA: Diagnosis not present

## 2021-03-09 LAB — LIPID PANEL
Cholesterol: 159 mg/dL (ref 0–200)
HDL: 61.8 mg/dL (ref >39.00)
LDL Cholesterol: 73 mg/dL (ref 0–99)
NonHDL: 97.46
Total CHOL/HDL Ratio: 3
Triglycerides: 124 mg/dL (ref 0.0–149.0)
VLDL: 24.8 mg/dL (ref 0.0–40.0)

## 2021-03-09 LAB — CBC WITH DIFFERENTIAL/PLATELET
Basophils Absolute: 0.1 10*3/uL (ref 0.0–0.1)
Basophils Relative: 0.5 % (ref 0.0–3.0)
Eosinophils Absolute: 0.4 10*3/uL (ref 0.0–0.7)
Eosinophils Relative: 3.5 % (ref 0.0–5.0)
HCT: 45.6 % (ref 39.0–52.0)
Hemoglobin: 15.1 g/dL (ref 13.0–17.0)
Lymphocytes Relative: 26.3 % (ref 12.0–46.0)
Lymphs Abs: 2.8 10*3/uL (ref 0.7–4.0)
MCHC: 33.1 g/dL (ref 30.0–36.0)
MCV: 90.3 fl (ref 78.0–100.0)
Monocytes Absolute: 0.9 10*3/uL (ref 0.1–1.0)
Monocytes Relative: 8 % (ref 3.0–12.0)
Neutro Abs: 6.7 10*3/uL (ref 1.4–7.7)
Neutrophils Relative %: 61.7 % (ref 43.0–77.0)
Platelets: 305 10*3/uL (ref 150.0–400.0)
RBC: 5.05 Mil/uL (ref 4.22–5.81)
RDW: 13.7 % (ref 11.5–15.5)
WBC: 10.8 10*3/uL — ABNORMAL HIGH (ref 4.0–10.5)

## 2021-03-09 LAB — PSA: PSA: 3.92 ng/mL (ref 0.10–4.00)

## 2021-03-09 LAB — BASIC METABOLIC PANEL
BUN: 19 mg/dL (ref 6–23)
CO2: 32 mEq/L (ref 19–32)
Calcium: 9.9 mg/dL (ref 8.4–10.5)
Chloride: 100 mEq/L (ref 96–112)
Creatinine, Ser: 1.3 mg/dL (ref 0.40–1.50)
GFR: 57 mL/min — ABNORMAL LOW (ref >60.00)
Glucose, Bld: 126 mg/dL — ABNORMAL HIGH (ref 70–99)
Potassium: 4.5 mEq/L (ref 3.5–5.1)
Sodium: 138 mEq/L (ref 135–145)

## 2021-03-09 LAB — HEMOGLOBIN A1C: Hgb A1c MFr Bld: 6.6 % — ABNORMAL HIGH (ref 4.6–6.5)

## 2021-03-09 LAB — TSH: TSH: 3.67 u[IU]/mL (ref 0.35–5.50)

## 2021-03-09 LAB — HEPATIC FUNCTION PANEL
ALT: 35 U/L (ref 0–53)
AST: 22 U/L (ref 0–37)
Albumin: 4.6 g/dL (ref 3.5–5.2)
Alkaline Phosphatase: 56 U/L (ref 39–117)
Bilirubin, Direct: 0.1 mg/dL (ref 0.0–0.3)
Total Bilirubin: 0.4 mg/dL (ref 0.2–1.2)
Total Protein: 7.2 g/dL (ref 6.0–8.3)

## 2021-03-09 MED ORDER — LEVOFLOXACIN 500 MG PO TABS: 500.0000 mg | ORAL_TABLET | Freq: Every day | ORAL | 0 refills | Status: AC

## 2021-03-09 MED ORDER — LOSARTAN POTASSIUM-HCTZ 50-12.5 MG PO TABS: 1.0000 | ORAL_TABLET | Freq: Every day | ORAL | 3 refills | Status: DC

## 2021-03-09 MED ORDER — ATORVASTATIN CALCIUM 10 MG PO TABS: 10.0000 mg | ORAL_TABLET | Freq: Every day | ORAL | 3 refills | Status: DC

## 2021-03-09 NOTE — Addendum Note (Signed)
Addended by: Alysia Penna A on: 03/09/2021 09:20 AM   Modules accepted: Orders

## 2021-03-09 NOTE — Progress Notes (Signed)
Subjective:    Patient ID: Curtis Duran, male    DOB: 06-19-1955, 66 y.o.   MRN: [id number]  HPI Here for a well exam. He had been doing well until 8 days ago when he developed symptoms of a sinus infection again. He took 2 courses of Augmentin in December and January for this, and it seemed to be over. He now has sinus congestion, PND , and coughing up green sputum. No fever or SOB.   Review of Systems  Constitutional: Negative.   HENT:  Positive for congestion, postnasal drip and sinus pressure. Negative for sore throat.   Eyes: Negative.   Respiratory:  Positive for cough. Negative for shortness of breath and wheezing.   Cardiovascular: Negative.   Gastrointestinal: Negative.   Genitourinary: Negative.   Musculoskeletal: Negative.   Skin: Negative.   Neurological: Negative.   Psychiatric/Behavioral: Negative.        Objective:   Physical Exam Constitutional:      General: He is not in acute distress.    Appearance: He is well-developed. He is obese. He is not diaphoretic.  HENT:     Head: Normocephalic and atraumatic.     Right Ear: External ear normal.     Left Ear: External ear normal.     Nose: Nose normal.     Mouth/Throat:     Pharynx: No oropharyngeal exudate.  Eyes:     General: No scleral icterus.       Right eye: No discharge.        Left eye: No discharge.     Conjunctiva/sclera: Conjunctivae normal.     Pupils: Pupils are equal, round, and reactive to light.  Neck:     Thyroid: No thyromegaly.     Vascular: No JVD.     Trachea: No tracheal deviation.  Cardiovascular:     Rate and Rhythm: Normal rate and regular rhythm.     Heart sounds: Normal heart sounds. No murmur heard.   No friction rub. No gallop.  Pulmonary:     Effort: Pulmonary effort is normal. No respiratory distress.     Breath sounds: Normal breath sounds. No wheezing or rales.  Chest:     Chest wall: No tenderness.  Abdominal:     General: Bowel sounds are normal. There is no  distension.     Palpations: Abdomen is soft. There is no mass.     Tenderness: There is no abdominal tenderness. There is no guarding or rebound.  Genitourinary:    Penis: Normal. No tenderness.      Testes: Normal.     Prostate: Normal.     Rectum: Normal. Guaiac result negative.  Musculoskeletal:        General: No tenderness. Normal range of motion.     Cervical back: Neck supple.  Lymphadenopathy:     Cervical: No cervical adenopathy.  Skin:    General: Skin is warm and dry.     Coloration: Skin is not pale.     Findings: No erythema or rash.  Neurological:     Mental Status: He is alert and oriented to person, place, and time.     Cranial Nerves: No cranial nerve deficit.     Motor: No abnormal muscle tone.     Coordination: Coordination normal.     Deep Tendon Reflexes: Reflexes are normal and symmetric. Reflexes normal.  Psychiatric:        Behavior: Behavior normal.        Thought Content:  Thought content normal.        Judgment: Judgment normal.          Assessment & Plan:  Well exam. We discussed diet and exercise. Get fasting labs. For the recurrent sinusitis, he will take 10 days of Levaquin. His HTN is now well controlled.  Alysia Penna, MD

## 2021-03-15 ENCOUNTER — Encounter: Payer: Self-pay | Admitting: Gastroenterology

## 2021-03-29 ENCOUNTER — Encounter: Payer: Self-pay | Admitting: Gastroenterology

## 2021-04-14 ENCOUNTER — Other Ambulatory Visit: Payer: Self-pay

## 2021-04-14 ENCOUNTER — Ambulatory Visit (AMBULATORY_SURGERY_CENTER): Payer: BC Managed Care – PPO | Admitting: *Deleted

## 2021-04-14 VITALS — Ht 76.0 in | Wt 277.0 lb

## 2021-04-14 DIAGNOSIS — Z86010 Personal history of colonic polyps: Secondary | ICD-10-CM

## 2021-04-14 MED ORDER — NA SULFATE-K SULFATE-MG SULF 17.5-3.13-1.6 GM/177ML PO SOLN: 2.0000 | Freq: Once | ORAL | 0 refills | Status: AC

## 2021-04-14 NOTE — Progress Notes (Signed)

## 2021-04-23 ENCOUNTER — Other Ambulatory Visit: Payer: Self-pay | Admitting: Family Medicine

## 2021-04-26 ENCOUNTER — Encounter: Payer: Self-pay | Admitting: Gastroenterology

## 2021-04-27 DIAGNOSIS — H401131 Primary open-angle glaucoma, bilateral, mild stage: Secondary | ICD-10-CM | POA: Diagnosis not present

## 2021-04-27 DIAGNOSIS — H2513 Age-related nuclear cataract, bilateral: Secondary | ICD-10-CM | POA: Diagnosis not present

## 2021-05-05 ENCOUNTER — Ambulatory Visit (AMBULATORY_SURGERY_CENTER): Payer: BC Managed Care – PPO | Admitting: Gastroenterology

## 2021-05-05 ENCOUNTER — Encounter: Payer: BC Managed Care – PPO | Admitting: Gastroenterology

## 2021-05-05 ENCOUNTER — Encounter: Payer: Self-pay | Admitting: Gastroenterology

## 2021-05-05 VITALS — BP 127/81 | HR 88 | Temp 97.3°F | Resp 23 | Ht 76.0 in | Wt 277.0 lb

## 2021-05-05 DIAGNOSIS — D124 Benign neoplasm of descending colon: Secondary | ICD-10-CM | POA: Diagnosis not present

## 2021-05-05 DIAGNOSIS — Z86010 Personal history of colonic polyps: Secondary | ICD-10-CM | POA: Diagnosis not present

## 2021-05-05 DIAGNOSIS — Z1211 Encounter for screening for malignant neoplasm of colon: Secondary | ICD-10-CM | POA: Diagnosis not present

## 2021-05-05 HISTORY — PX: COLONOSCOPY: SHX174

## 2021-05-05 MED ORDER — SODIUM CHLORIDE 0.9 % IV SOLN: 500.0000 mL | INTRAVENOUS | Status: DC

## 2021-05-05 NOTE — Progress Notes (Signed)
Pt's states no medical or surgical changes since previsit or office visit. 

## 2021-05-05 NOTE — Op Note (Signed)
[facility] ?Patient Name: Curtis Duran ?Procedure Date: 05/05/2021 11:10 AM ?MRN: [id number] ?Endoscopist: Estill Cotta. Loletha Carrow , MD ?Age: 66 ?Referring MD:  ?Date of Birth: 09/19/1955 ?Gender: Male ?Account #: [id number] ?Procedure:                Colonoscopy ?Indications:              Surveillance: Personal history of adenomatous  ?                          polyps on last colonoscopy 3 years ago ?                          TA x 4 (one 48m) March 2020; 518mTA in 2014 ?Medicines:                Monitored Anesthesia Care ?Procedure:                Pre-Anesthesia Assessment: ?                          - Prior to the procedure, a History and Physical  ?                          was performed, and patient medications and  ?                          allergies were reviewed. The patient's tolerance of  ?                          previous anesthesia was also reviewed. The risks  ?                          and benefits of the procedure and the sedation  ?                          options and risks were discussed with the patient.  ?                          All questions were answered, and informed consent  ?                          was obtained. Prior Anticoagulants: The patient has  ?                          taken no previous anticoagulant or antiplatelet  ?                          agents. ASA Grade Assessment: II - A patient with  ?                          mild systemic disease. After reviewing the risks  ?                          and benefits, the patient was deemed in  ?  satisfactory condition to undergo the procedure. ?                          After obtaining informed consent, the colonoscope  ?                          was passed under direct vision. Throughout the  ?                          procedure, the patient's blood pressure, pulse, and  ?                          oxygen saturations were monitored continuously. The  ?                          Olympus CF-HQ190L (Serial# [id number])  Colonoscope was  ?                          introduced through the anus and advanced to the the  ?                          cecum, identified by appendiceal orifice and  ?                          ileocecal valve. The colonoscopy was somewhat  ?                          difficult due to a redundant colon. Successful  ?                          completion of the procedure was aided by using  ?                          manual pressure and straightening and shortening  ?                          the scope to obtain bowel loop reduction. The  ?                          patient tolerated the procedure well. The quality  ?                          of the bowel preparation was good with lavage. The  ?                          ileocecal valve, appendiceal orifice, and rectum  ?                          were photographed. ?Scope In: 11:29:40 AM ?Scope Out: 11:47:24 AM ?Scope Withdrawal Time: 0 hours 9 minutes 40 seconds  ?Total Procedure Duration: 0 hours 17 minutes 44 seconds  ?Findings:                 The perianal and digital rectal examinations were  ?  normal. ?                          Repeat examination of right colon under NBI  ?                          performed. ?                          Multiple small and large-mouthed diverticula were  ?                          found in the left colon and right colon. ?                          A diminutive polyp was found in the descending  ?                          colon. The polyp was sessile. The polyp was removed  ?                          with a cold snare. Resection and retrieval were  ?                          complete. ?                          The exam was otherwise without abnormality on  ?                          direct and retroflexion views. ?Complications:            No immediate complications. ?Estimated Blood Loss:     Estimated blood loss was minimal. ?Impression:               - Diverticulosis in the left colon and in the right   ?                          colon. ?                          - One diminutive polyp in the descending colon,  ?                          removed with a cold snare. Resected and retrieved. ?                          - The examination was otherwise normal on direct  ?                          and retroflexion views. ?Recommendation:           - Patient has a contact number available for  ?                          emergencies. The signs and symptoms of potential  ?  delayed complications were discussed with the  ?                          patient. Return to normal activities tomorrow.  ?                          Written discharge instructions were provided to the  ?                          patient. ?                          - Resume previous diet. ?                          - Continue present medications. ?                          - Await pathology results. ?                          - Repeat colonoscopy in 5 years for surveillance. ?Secret Kristensen L. Loletha Carrow, MD ?05/05/2021 11:56:10 AM ?This report has been signed electronically. ?

## 2021-05-05 NOTE — Progress Notes (Signed)
Called to room to assist during endoscopic procedure.  Patient ID and intended procedure confirmed with present staff. Received instructions for my participation in the procedure from the performing physician.  

## 2021-05-05 NOTE — Progress Notes (Signed)
Report to PACU, RN, vss, BBS= Clear.  

## 2021-05-05 NOTE — Patient Instructions (Signed)
Please see handouts given to you on Polyps, Diverticulosis and High Fiber Diet. ?Thank you for letting us take care of your healthcare needs today. ? ? ?YOU HAD AN ENDOSCOPIC PROCEDURE TODAY AT [facility]:   Refer to the procedure report that was given to you for any specific questions about what was found during the examination.  If the procedure report does not answer your questions, please call your gastroenterologist to clarify.  If you requested that your care partner not be given the details of your procedure findings, then the procedure report has been included in a sealed envelope for you to review at your convenience later. ? ?YOU SHOULD EXPECT: Some feelings of bloating in the abdomen. Passage of more gas than usual.  Walking can help get rid of the air that was put into your GI tract during the procedure and reduce the bloating. If you had a lower endoscopy (such as a colonoscopy or flexible sigmoidoscopy) you may notice spotting of blood in your stool or on the toilet paper. If you underwent a bowel prep for your procedure, you may not have a normal bowel movement for a few days. ? ?Please Note:  You might notice some irritation and congestion in your nose or some drainage.  This is from the oxygen used during your procedure.  There is no need for concern and it should clear up in a day or so. ? ?SYMPTOMS TO REPORT IMMEDIATELY: ? ?Following lower endoscopy (colonoscopy or flexible sigmoidoscopy): ? Excessive amounts of blood in the stool ? Significant tenderness or worsening of abdominal pains ? Swelling of the abdomen that is new, acute ? Fever of 100?F or higher ? ? ?For urgent or emergent issues, a gastroenterologist can be reached at any hour by calling [phone]. ?Do not use MyChart messaging for urgent concerns.  ? ? ?DIET:  We do recommend a small meal at first, but then you may proceed to your regular diet.  Drink plenty of fluids but you should avoid alcoholic beverages  for 24 hours. ? ?ACTIVITY:  You should plan to take it easy for the rest of today and you should NOT DRIVE or use heavy machinery until tomorrow (because of the sedation medicines used during the test).   ? ?FOLLOW UP: ?Our staff will call the number listed on your records 48-72 hours following your procedure to check on you and address any questions or concerns that you may have regarding the information given to you following your procedure. If we do not reach you, we will leave a message.  We will attempt to reach you two times.  During this call, we will ask if you have developed any symptoms of COVID 19. If you develop any symptoms (ie: fever, flu-like symptoms, shortness of breath, cough etc.) before then, please call [phone].  If you test positive for Covid 19 in the 2 weeks post procedure, please call and report this information to [place].   ? ?If any biopsies were taken you will be contacted by phone or by letter within the next 1-3 weeks.  Please call us at [phone] if you have not heard about the biopsies in 3 weeks.  ? ? ?SIGNATURES/CONFIDENTIALITY: ?You and/or your care partner have signed paperwork which will be entered into your electronic medical record.  These signatures attest to the fact that that the information above on your After Visit Summary has been reviewed and is understood.  Full responsibility of the confidentiality of this  discharge information lies with you and/or your care-partner.  ?

## 2021-05-05 NOTE — Progress Notes (Signed)
History and Physical: ? This patient presents for endoscopic testing for: ?Encounter Diagnosis  ?Name Primary?  ? Personal history of colonic polyps Yes  ? ? ?TA x 21 March 2018 ?TA x 1 2014 ?Patient denies chronic abdominal pain, rectal bleeding, constipation or diarrhea. ? ? ?ROS: ?Patient denies chest pain or shortness of breath ? ? ?Past Medical History: ?Past Medical History:  ?Diagnosis Date  ? Anxiety   ? Bronchial pneumonia   ? back in January  ? Carpal tunnel syndrome, bilateral   ? Depression   ? Diabetes mellitus without complication ([place])   ? Eczema   ? FRONT OF CHEST, MIDDLE OF BACK, FRONT OF LEGS  ? GERD (gastroesophageal reflux disease)   ? Glaucoma   ? sees Dr. Camille Bal at [facility].  ? Heart murmur   ? as child   ? Hyperlipidemia   ? Lightheadedness   ? Low back pain   ? Sleep apnea   ? wears cpap   ? ? ? ?Past Surgical History: ?Past Surgical History:  ?Procedure Laterality Date  ? COLONOSCOPY  03/19/2018  ? per Dr. Loletha Carrow, multiple adenomatous polyps,repeat in 3 yrs   ? LIPOMA EXCISION N/A 12/01/2015  ? Procedure: EXCISION OF LIPOMA OF BACK;  Surgeon: Erroll Luna, MD;  Location: [place];  Service: General;  Laterality: N/A;  ? POLYPECTOMY    ? ? ?Allergies: ?Allergies  ?Allergen Reactions  ? Citalopram Hydrobromide Rash  ? Crestor [Rosuvastatin] Rash  ? ? ?Outpatient Meds: ?Current Outpatient Medications  ?Medication Sig Dispense Refill  ? amLODipine (NORVASC) 5 MG tablet amlodipine 5 mg tablet 30 tablet 2  ? atorvastatin (LIPITOR) 10 MG tablet Take 1 tablet (10 mg total) by mouth daily. 90 tablet 3  ? buPROPion (WELLBUTRIN XL) 300 MG 24 hr tablet TAKE 1 TABLET BY MOUTH EVERY DAY 90 tablet 0  ? celecoxib (CELEBREX) 200 MG capsule TAKE 1 CAPSULE BY MOUTH TWICE A DAY 180 capsule 3  ? cetirizine (ZYRTEC) 10 MG tablet Take 10 mg by mouth daily.    ? dorzolamide-timolol (COSOPT) 22.3-6.8 MG/ML ophthalmic solution Place 1 drop into both eyes daily. (Patient taking differently: Place 1 drop into both  eyes 2 (two) times daily.) 10 mL 0  ? DULoxetine (CYMBALTA) 30 MG capsule TAKE 1 CAPSULE BY MOUTH EVERY DAY 90 capsule 0  ? latanoprost (XALATAN) 0.005 % ophthalmic solution Place 1 drop into both eyes at bedtime.    ? losartan-hydrochlorothiazide (HYZAAR) 50-12.5 MG tablet Take 1 tablet by mouth daily. 90 tablet 3  ? metFORMIN (GLUCOPHAGE) 500 MG tablet TAKE 1 TABLET BY MOUTH 2 TIMES DAILY WITH A MEAL. 180 tablet 0  ? omeprazole (PRILOSEC) 40 MG capsule TAKE 1 CAPSULE BY MOUTH TWICE A DAY 180 capsule 0  ? PRESCRIPTION MEDICATION Rhopresso 2.5 mg 1 gtt both eyes daily    ? RHOPRESSA 0.02 % SOLN Apply 1 drop to eye daily.    ? fluticasone (FLONASE) 50 MCG/ACT nasal spray Place 1 spray into both nostrils 2 (two) times daily. (Patient not taking: Reported on 04/14/2021) 16 g 12  ? ?Current Facility-Administered Medications  ?Medication Dose Route Frequency Provider Last Rate Last Admin  ? 0.9 %  sodium chloride infusion  500 mL Intravenous Continuous Danis, Kirke Corin, MD      ? ? ? ? ?___________________________________________________________________ ?Objective  ? ?Exam: ? ?BP (!) 146/94   Pulse (!) 101   Temp (!) 97.3 ?F (36.3 ?C) (Temporal)   Resp 15   Ht  $'6\' 4"'d$  (1.93 m)   Wt 277 lb (125.6 kg)   SpO2 98%   BMI 33.72 kg/m?  ? ?CV: RRR without murmur, S1/S2 ?Resp: clear to auscultation bilaterally, normal RR and effort noted ?GI: soft, no tenderness, with active bowel sounds. ? ? ?Assessment: ?Encounter Diagnosis  ?Name Primary?  ? Personal history of colonic polyps Yes  ? ? ? ?Plan: ?Colonoscopy ? The benefits and risks of the planned procedure were described in detail with the patient or (when appropriate) their health care proxy.  Risks were outlined as including, but not limited to, bleeding, infection, perforation, adverse medication reaction leading to cardiac or pulmonary decompensation, pancreatitis (if ERCP).  The limitation of incomplete mucosal visualization was also discussed.  No guarantees or  warranties were given. ? ? ? ?The patient is appropriate for an endoscopic procedure in the ambulatory setting. ? ? - Wilfrid Lund, MD ? ? ? ? ?

## 2021-05-06 ENCOUNTER — Other Ambulatory Visit: Payer: Self-pay | Admitting: Family Medicine

## 2021-05-07 ENCOUNTER — Telehealth: Payer: Self-pay

## 2021-05-07 ENCOUNTER — Other Ambulatory Visit: Payer: Self-pay | Admitting: Family Medicine

## 2021-05-07 DIAGNOSIS — I10 Essential (primary) hypertension: Secondary | ICD-10-CM

## 2021-05-07 NOTE — Telephone Encounter (Signed)
?  Follow up Call- ? ? ?  05/05/2021  ?  9:54 AM  ?Call back number  ?Post procedure Call Back phone  # [phone]  ?Permission to leave phone message Yes  ?  ? ?Patient questions: ? ?Do you have a fever, pain , or abdominal swelling? No. ?Pain Score  0 * ? ?Have you tolerated food without any problems? Yes.   ? ?Have you been able to return to your normal activities? Yes.   ? ?Do you have any questions about your discharge instructions: ?Diet   No. ?Medications  No. ?Follow up visit  No. ? ?Do you have questions or concerns about your Care? No. ? ?Actions: ?* If pain score is 4 or above: ?No action needed, pain <4. ? ? ?

## 2021-05-10 ENCOUNTER — Encounter: Payer: Self-pay | Admitting: Gastroenterology

## 2021-05-20 ENCOUNTER — Other Ambulatory Visit: Payer: Self-pay | Admitting: Family Medicine

## 2021-07-23 ENCOUNTER — Ambulatory Visit: Payer: BC Managed Care – PPO | Admitting: Family Medicine

## 2021-07-23 ENCOUNTER — Encounter: Payer: Self-pay | Admitting: Family Medicine

## 2021-07-23 VITALS — BP 124/68 | HR 75 | Temp 99.2°F | Wt 269.0 lb

## 2021-07-23 DIAGNOSIS — M542 Cervicalgia: Secondary | ICD-10-CM

## 2021-07-23 MED ORDER — METHYLPREDNISOLONE 4 MG PO TBPK: ORAL_TABLET | ORAL | 0 refills | Status: DC

## 2021-07-23 MED ORDER — CYCLOBENZAPRINE HCL 10 MG PO TABS: 10.0000 mg | ORAL_TABLET | Freq: Three times a day (TID) | ORAL | 1 refills | Status: DC | PRN

## 2021-07-23 NOTE — Progress Notes (Signed)
   Subjective:    Patient ID: Curtis Duran, male    DOB: 1955/02/05, 66 y.o.   MRN: [id number]  HPI Here for 3 weeks of a sharp pain in the right side of the back of his head. No recent trauma. The pain is constant, although it is not as severe as it was in the beginning. No pain down the arms. Aspirin helps a little. His BP has been stable. His am fasting glucoses are averaging 110-130.    Review of Systems  Constitutional: Negative.   Respiratory: Negative.    Cardiovascular: Negative.   Musculoskeletal:  Positive for neck pain.  Neurological:  Positive for headaches.       Objective:   Physical Exam Constitutional:      General: He is not in acute distress.    Appearance: Normal appearance.  Cardiovascular:     Rate and Rhythm: Normal rate and regular rhythm.     Pulses: Normal pulses.     Heart sounds: Normal heart sounds.  Pulmonary:     Effort: Pulmonary effort is normal.     Breath sounds: Normal breath sounds.  Musculoskeletal:     Comments: He is tender along the right side of the neck from the base of the skull to C7. ROM is limited by pain   Neurological:     Mental Status: He is alert.           Assessment & Plan:  He has pain from a pinched nerve in the neck. He can apply ice 3-4 times a day. He is also given a Medrol dose pack and Flexeril. Recheck as needed.  Alysia Penna, MD

## 2021-07-29 ENCOUNTER — Other Ambulatory Visit: Payer: Self-pay | Admitting: Family Medicine

## 2021-08-27 DIAGNOSIS — H401131 Primary open-angle glaucoma, bilateral, mild stage: Secondary | ICD-10-CM | POA: Diagnosis not present

## 2021-09-17 ENCOUNTER — Other Ambulatory Visit: Payer: Self-pay | Admitting: Family Medicine

## 2021-09-17 NOTE — Telephone Encounter (Signed)
Pt called to request a refill of the:  DULoxetine (CYMBALTA) 30 MG capsule  Pt states he has been waiting over a week for this refill. Pt states he is completely out.  Pt was informed that MD is OOO, returning Tuesday.  LOV:  07/23/21  Please advise.   [facility]  [id number] [place]  [phone] Fax:  [phone]

## 2021-10-03 ENCOUNTER — Emergency Department (HOSPITAL_BASED_OUTPATIENT_CLINIC_OR_DEPARTMENT_OTHER)
Admission: EM | Admit: 2021-10-03 | Discharge: 2021-10-03 | Disposition: A | Discharge status: Home / Self Care | Payer: BC Managed Care – PPO | Attending: Emergency Medicine | Admitting: Emergency Medicine

## 2021-10-03 ENCOUNTER — Encounter (HOSPITAL_BASED_OUTPATIENT_CLINIC_OR_DEPARTMENT_OTHER): Payer: Self-pay | Admitting: Emergency Medicine

## 2021-10-03 ENCOUNTER — Ambulatory Visit (HOSPITAL_COMMUNITY)
Admission: EM | Admit: 2021-10-03 | Discharge: 2021-10-03 | Disposition: A | Discharge status: Home / Self Care | Payer: BC Managed Care – PPO | Attending: Internal Medicine | Admitting: Internal Medicine

## 2021-10-03 DIAGNOSIS — I10 Essential (primary) hypertension: Secondary | ICD-10-CM | POA: Insufficient documentation

## 2021-10-03 DIAGNOSIS — R000 Tachycardia, unspecified: Secondary | ICD-10-CM | POA: Diagnosis not present

## 2021-10-03 DIAGNOSIS — Z79899 Other long term (current) drug therapy: Secondary | ICD-10-CM | POA: Diagnosis not present

## 2021-10-03 DIAGNOSIS — R059 Cough, unspecified: Secondary | ICD-10-CM | POA: Diagnosis not present

## 2021-10-03 DIAGNOSIS — R1310 Dysphagia, unspecified: Secondary | ICD-10-CM | POA: Diagnosis not present

## 2021-10-03 DIAGNOSIS — I440 Atrioventricular block, first degree: Secondary | ICD-10-CM | POA: Diagnosis not present

## 2021-10-03 DIAGNOSIS — U071 COVID-19: Secondary | ICD-10-CM | POA: Diagnosis not present

## 2021-10-03 DIAGNOSIS — Z7982 Long term (current) use of aspirin: Secondary | ICD-10-CM | POA: Diagnosis not present

## 2021-10-03 DIAGNOSIS — E119 Type 2 diabetes mellitus without complications: Secondary | ICD-10-CM | POA: Diagnosis not present

## 2021-10-03 DIAGNOSIS — R070 Pain in throat: Secondary | ICD-10-CM | POA: Diagnosis not present

## 2021-10-03 DIAGNOSIS — R079 Chest pain, unspecified: Secondary | ICD-10-CM

## 2021-10-03 DIAGNOSIS — R110 Nausea: Secondary | ICD-10-CM | POA: Insufficient documentation

## 2021-10-03 DIAGNOSIS — R0789 Other chest pain: Secondary | ICD-10-CM | POA: Diagnosis not present

## 2021-10-03 LAB — SARS CORONAVIRUS 2 BY RT PCR: SARS Coronavirus 2 by RT PCR: POSITIVE — AB

## 2021-10-03 LAB — POCT INFECTIOUS MONO SCREEN, ED / UC: Mono Screen: NEGATIVE

## 2021-10-03 LAB — GROUP A STREP BY PCR: Group A Strep by PCR: NOT DETECTED

## 2021-10-03 NOTE — ED Triage Notes (Signed)
Pt arrived POV from urgent care. Pt reports he went to the urgent care today because for the past 5 days he has felt like there is something "stuck" in his throat. Pt further reports they told him to go to the ED because his ECG showed first degree heart block. Pt c/o intermittent SOB, nausea and left sided CP over the last few months but denies complaints at present.

## 2021-10-03 NOTE — Discharge Instructions (Signed)
Please go to the nearest emergency department for further evaluation of your new 1st degree heart block.

## 2021-10-03 NOTE — ED Provider Notes (Signed)
[place]    CSN: [id number] Arrival date & time: 10/03/21  1239      History   Chief Complaint No chief complaint on file.   HPI Curtis Duran is a 66 y.o. male.   Patient presents to urgent care for evaluation of throat pain, fatigue, and sensation of "something stuck in his throat".  Patient ate chicken wings approximately 1 week ago and believes that a piece of the bone may be stuck in his throat. Reports mild throat pain, swelling, and feels as though he "chokes" after each time that he eats.  He has never had a swallow study in the past.  He intermittently gets some left upper chest discomfort after eating mayonnaise and spicy foods but attributes this to acid reflux for which he takes medication prescribed by his PCP.  Reports slight shortness of breath but attributes this to "being out of shape".  He states he is "tired all the time" and every morning, he experiences episodes of nausea and sweating that resolve rather quickly without intervention.  He cannot identify the amount of time that these nausea/sweating episodes have been happening.  Denies difficulty swallowing, throat closing sensation, muffled voice sounds, dizziness, nasal congestion, ear pain, headache, eye drainage, neck pain, new back pain, and rash.     Past Medical History:  Diagnosis Date   Anxiety    Bronchial pneumonia    back in January   Carpal tunnel syndrome, bilateral    Depression    Diabetes mellitus without complication ([place])    Eczema    FRONT OF CHEST, MIDDLE OF BACK, FRONT OF LEGS   GERD (gastroesophageal reflux disease)    Glaucoma    sees Dr. Camille Bal at [facility].   Heart murmur    as child    Hyperlipidemia    Lightheadedness    Low back pain    Sleep apnea    wears cpap     Patient Active Problem List   Diagnosis Date Noted   HTN (hypertension) 12/29/2020   Primary osteoarthritis of left knee 03/05/2019   Chronic pain of both knees 02/27/2019   OSA  (obstructive sleep apnea) 07/04/2017   Diabetes mellitus without complication ([place]) 53/64/6803   Lipoma of skin and subcutaneous tissue of trunk 05/23/2014   Eczema 05/23/2014   MICROSCOPIC HEMATURIA 01/29/2010   CONSTIPATION, CHRONIC 11/26/2007   SKIN LESION 11/26/2007   LEG CRAMPS, NOCTURNAL 11/26/2007   HYPERLIPIDEMIA 10/27/2006   LOW BACK PAIN 10/27/2006   Depression, major, single episode, moderate ([place]) 10/20/2006   GERD 10/20/2006   HEADACHE 10/20/2006    Past Surgical History:  Procedure Laterality Date   CERVICAL SPINE SURGERY  2014   per Dr. Ashok Pall   COLONOSCOPY  03/19/2018   per Dr. Loletha Carrow, multiple adenomatous polyps,repeat in 3 yrs    LIPOMA EXCISION N/A 12/01/2015   Procedure: EXCISION OF LIPOMA OF BACK;  Surgeon: Erroll Luna, MD;  Location: [place];  Service: General;  Laterality: N/A;   POLYPECTOMY         Home Medications    Prior to Admission medications   Medication Sig Start Date End Date Taking? Authorizing Provider  amLODipine (NORVASC) 5 MG tablet TAKE 1 TABLET BY MOUTH EVERY DAY 05/07/21   Laurey Morale, MD  atorvastatin (LIPITOR) 10 MG tablet Take 1 tablet (10 mg total) by mouth daily. 03/09/21   Laurey Morale, MD  buPROPion (WELLBUTRIN XL) 300 MG 24 hr tablet TAKE 1 TABLET BY  MOUTH EVERY DAY 07/29/21   Laurey Morale, MD  celecoxib (CELEBREX) 200 MG capsule TAKE 1 CAPSULE BY MOUTH TWICE A DAY 02/23/21   Laurey Morale, MD  cetirizine (ZYRTEC) 10 MG tablet Take 10 mg by mouth daily. 04/17/21   [provider]  cyclobenzaprine (FLEXERIL) 10 MG tablet Take 1 tablet (10 mg total) by mouth 3 (three) times daily as needed for muscle spasms. 07/23/21   Laurey Morale, MD  dorzolamide-timolol (COSOPT) 22.3-6.8 MG/ML ophthalmic solution Place 1 drop into both eyes daily. Patient taking differently: Place 1 drop into both eyes 2 (two) times daily. 01/30/15   Laurey Morale, MD  DULoxetine (CYMBALTA) 30 MG capsule TAKE 1 CAPSULE BY MOUTH EVERY DAY 09/17/21    Laurey Morale, MD  latanoprost (XALATAN) 0.005 % ophthalmic solution Place 1 drop into both eyes at bedtime. 10/21/15   [provider]  losartan-hydrochlorothiazide (HYZAAR) 50-12.5 MG tablet Take 1 tablet by mouth daily. 03/09/21   Laurey Morale, MD  metFORMIN (GLUCOPHAGE) 500 MG tablet TAKE 1 TABLET BY MOUTH 2 TIMES DAILY WITH A MEAL. 04/26/21   Laurey Morale, MD  methylPREDNISolone (MEDROL DOSEPAK) 4 MG TBPK tablet As directed 07/23/21   Laurey Morale, MD  omeprazole (PRILOSEC) 40 MG capsule TAKE 1 CAPSULE BY MOUTH TWICE A DAY 07/29/21   Laurey Morale, MD  PRESCRIPTION MEDICATION Rhopresso 2.5 mg 1 gtt both eyes daily    [provider]  RHOPRESSA 0.02 % SOLN Apply 1 drop to eye daily. 10/17/18   [provider]    Family History Family History  Problem Relation Age of Onset   Cancer Father    Diabetes Other    Colon polyps Neg Hx    Esophageal cancer Neg Hx    Rectal cancer Neg Hx    Stomach cancer Neg Hx    Colon cancer Neg Hx     Social History Social History   Tobacco Use   Smoking status: Never   Smokeless tobacco: Never  Vaping Use   Vaping Use: Never used  Substance Use Topics   Alcohol use: No    Alcohol/week: 0.0 standard drinks of alcohol   Drug use: No     Allergies   Citalopram hydrobromide and Crestor [rosuvastatin]   Review of Systems Review of Systems Per HPI  Physical Exam Triage Vital Signs ED Triage Vitals [10/03/21 1358]  Enc Vitals Group     BP 118/74     Pulse Rate (!) 102     Resp 18     Temp 98.3 F (36.8 C)     Temp Source Oral     SpO2 98 %     Weight      Height      Head Circumference      Peak Flow      Pain Score      Pain Loc      Pain Edu?      Excl. in [place]?    No data found.  Updated Vital Signs BP 118/74 (BP Location: Left Arm)   Pulse (!) 102   Temp 98.3 F (36.8 C) (Oral)   Resp 18   SpO2 98%   Visual Acuity Right Eye Distance:   Left Eye Distance:   Bilateral Distance:     Right Eye Near:   Left Eye Near:    Bilateral Near:     Physical Exam Vitals and nursing note reviewed.  Constitutional:  Appearance: Normal appearance. He is not ill-appearing or toxic-appearing.     Comments: Very pleasant patient sitting on exam in position of comfort table in no acute distress.   HENT:     Head: Normocephalic and atraumatic.     Right Ear: Hearing and external ear normal.     Left Ear: Hearing and external ear normal.     Nose: Nose normal.     Mouth/Throat:     Lips: Pink.     Mouth: Mucous membranes are moist.     Comments: No posterior oropharynx swelling although there is mild erythema.  No obvious foreign body visible to the posterior oropharynx.  No tonsillar exudate or tonsillar abscess.  Dentition is normal. Eyes:     General: Lids are normal. Vision grossly intact. Gaze aligned appropriately.     Extraocular Movements: Extraocular movements intact.     Conjunctiva/sclera: Conjunctivae normal.  Neck:     Trachea: Phonation normal.      Comments: Possible parotid gland swelling to the left side versus cervical lymphadenopathy.  Subjective "internal pain" with palpation of the area outlined above.  No redness or warmth. Cardiovascular:     Rate and Rhythm: Tachycardia present. Rhythm regularly irregular.     Heart sounds: S1 normal and S2 normal.  Pulmonary:     Effort: Pulmonary effort is normal. No respiratory distress.     Breath sounds: Normal breath sounds and air entry.     Comments: No adventitious lung sounds to auscultation.  No respiratory distress.  Lung sounds clear to auscultation bilaterally. Abdominal:     Palpations: Abdomen is soft.  Musculoskeletal:     Cervical back: Normal range of motion and neck supple.     Right lower leg: No edema.     Left lower leg: No edema.  Skin:    General: Skin is warm and dry.     Capillary Refill: Capillary refill takes less than 2 seconds.     Findings: No rash.  Neurological:      General: No focal deficit present.     Mental Status: He is alert and oriented to person, place, and time. Mental status is at baseline.     Cranial Nerves: No dysarthria or facial asymmetry.     Gait: Gait is intact.  Psychiatric:        Mood and Affect: Mood normal.        Speech: Speech normal.        Behavior: Behavior normal.        Thought Content: Thought content normal.        Judgment: Judgment normal.      UC Treatments / Results  Labs (all labs ordered are listed, but only abnormal results are displayed) Labs Reviewed  POCT INFECTIOUS MONO SCREEN, ED / UC    EKG   Radiology No results found.  Procedures Procedures (including critical care time)  Medications Ordered in UC Medications - No data to display  Initial Impression / Assessment and Plan / UC Course  I have reviewed the triage vital signs and the nursing notes.  Pertinent labs & imaging results that were available during my care of the patient were reviewed by me and considered in my medical decision making (see chart for details).   1.  Heart block first-degree EKG obtained due to irregularly irregular heart rhythm to auscultation of heart sounds.  EKG shows new first-degree AV heart block without ST depression or elevation.  Patient is not having  any chest pain at this current time although I recommend he go to the nearest emergency department for cardiac work-up due to recent symptoms of nausea and diaphoresis in the mornings as well as fatigue and intermittent chest discomfort.  He is stable to go to the nearest emergency department by personal vehicle due to hemodynamically stable vital signs and stable clinical appearance.  Discussed risks of deferring emergency department visit to which patient verbalizes understanding and agreement with plan.  Patient discharged from urgent care to the nearest emergency department in stable condition.  2.  Throat pain Unable to discern whether the swelling to  palpation is of the parotid gland or cervical lymphadenopathy.  Monospot test obtained due to left-sided probable lymphadenopathy, throat pain, and generalized fatigue.  Monospot test negative in clinic.  Unknown etiology of patient's throat swelling and sensation of object stuck in the throat although I have a low suspicion for foreign body to the posterior oropharynx or esophagus at this time based on physical exam findings and stable vital signs.  Advised patient that follow-up with PCP regarding "choking sensation" after eating and for possible swallow study in the future to evaluate for swallowing abnormality outpatient.   Discussed physical exam and available lab work findings in clinic with patient.  Counseled patient regarding appropriate use of medications and potential side effects for all medications recommended or prescribed today. Discussed red flag signs and symptoms of worsening condition,when to call the PCP office, return to urgent care, and when to seek higher level of care in the emergency department. Patient verbalizes understanding and agreement with plan. All questions answered. Patient discharged in stable condition.   Final Clinical Impressions(s) / UC Diagnoses   Final diagnoses:  Heart block AV first degree  Tachycardia  Throat pain     Discharge Instructions      Please go to the nearest emergency department for further evaluation of your new 1st degree heart block.      ED Prescriptions   None    PDMP not reviewed this encounter.   Talbot Grumbling, [place] 10/03/21 [phone]

## 2021-10-03 NOTE — ED Provider Notes (Signed)
[facility]ER[facility]ENC[facility] DEPT Provider Note   CSN: [id number] Arrival date & time: 10/03/21  1613     History {Add pertinent medical, surgical, social history, OB history to HPI:1} Chief Complaint  Patient presents with   Dysphagia    MACKENZY EISENBERG is a 66 y.o. male presenting to ED with multiple complaints.  The patient was referred to the ED as he was feeling nauseated and feels something sharp is in his throat.  He is not feeling this way for a week, preceding that last week he had a head cold, with cough and congestion.  He reports he sometimes gets chest pains or pressures in the left side of his chest typically worse after he eats.  He does suffer from reflux.  He has a history of hypertension, hyperlipidemia, diabetes.  He is not a smoker.  There is a family history of MI in both of his brothers, both after the age of 76.  The patient has never had a cardiac work-up.  HPI     Home Medications Prior to Admission medications   Medication Sig Start Date End Date Taking? Authorizing Provider  amLODipine (NORVASC) 5 MG tablet TAKE 1 TABLET BY MOUTH EVERY DAY 05/07/21   Laurey Morale, MD  atorvastatin (LIPITOR) 10 MG tablet Take 1 tablet (10 mg total) by mouth daily. 03/09/21   Laurey Morale, MD  buPROPion (WELLBUTRIN XL) 300 MG 24 hr tablet TAKE 1 TABLET BY MOUTH EVERY DAY 07/29/21   Laurey Morale, MD  celecoxib (CELEBREX) 200 MG capsule TAKE 1 CAPSULE BY MOUTH TWICE A DAY 02/23/21   Laurey Morale, MD  cetirizine (ZYRTEC) 10 MG tablet Take 10 mg by mouth daily. 04/17/21   [provider]  cyclobenzaprine (FLEXERIL) 10 MG tablet Take 1 tablet (10 mg total) by mouth 3 (three) times daily as needed for muscle spasms. 07/23/21   Laurey Morale, MD  dorzolamide-timolol (COSOPT) 22.3-6.8 MG/ML ophthalmic solution Place 1 drop into both eyes daily. Patient taking differently: Place 1 drop into both eyes 2 (two) times daily. 01/30/15   Laurey Morale, MD  DULoxetine  (CYMBALTA) 30 MG capsule TAKE 1 CAPSULE BY MOUTH EVERY DAY 09/17/21   Laurey Morale, MD  latanoprost (XALATAN) 0.005 % ophthalmic solution Place 1 drop into both eyes at bedtime. 10/21/15   [provider]  losartan-hydrochlorothiazide (HYZAAR) 50-12.5 MG tablet Take 1 tablet by mouth daily. 03/09/21   Laurey Morale, MD  metFORMIN (GLUCOPHAGE) 500 MG tablet TAKE 1 TABLET BY MOUTH 2 TIMES DAILY WITH A MEAL. 04/26/21   Laurey Morale, MD  methylPREDNISolone (MEDROL DOSEPAK) 4 MG TBPK tablet As directed 07/23/21   Laurey Morale, MD  omeprazole (PRILOSEC) 40 MG capsule TAKE 1 CAPSULE BY MOUTH TWICE A DAY 07/29/21   Laurey Morale, MD  PRESCRIPTION MEDICATION Rhopresso 2.5 mg 1 gtt both eyes daily    [provider]  RHOPRESSA 0.02 % SOLN Apply 1 drop to eye daily. 10/17/18   [provider]      Allergies    Citalopram hydrobromide and Crestor [rosuvastatin]    Review of Systems   Review of Systems  Physical Exam Updated Vital Signs BP (!) 141/80   Pulse (!) 101   Temp 99 F (37.2 C) (Oral)   Resp 17   Ht '6\' 4"'$  (1.93 m)   Wt 122.5 kg   SpO2 97%   BMI 32.87 kg/m  Physical Exam Constitutional:  General: He is not in acute distress. HENT:     Head: Normocephalic and atraumatic.     Comments: Tender left cervical lymphadenopathy Eyes:     Conjunctiva/sclera: Conjunctivae normal.     Pupils: Pupils are equal, round, and reactive to light.  Cardiovascular:     Rate and Rhythm: Normal rate and regular rhythm.  Pulmonary:     Effort: Pulmonary effort is normal. No respiratory distress.  Abdominal:     General: There is no distension.     Tenderness: There is no abdominal tenderness.  Skin:    General: Skin is warm and dry.  Neurological:     General: No focal deficit present.     Mental Status: He is alert and oriented to person, place, and time. Mental status is at baseline.  Psychiatric:        Mood and Affect: Mood normal.        Behavior: Behavior  normal.     ED Results / Procedures / Treatments   Labs (all labs ordered are listed, but only abnormal results are displayed) Labs Reviewed  SARS CORONAVIRUS 2 BY RT PCR - Abnormal; Notable for the following components:      Result Value   SARS Coronavirus 2 by RT PCR POSITIVE (*)    All other components within normal limits  GROUP A STREP BY PCR    EKG None  Radiology No results found.  Procedures Procedures  {Document cardiac monitor, telemetry assessment procedure when appropriate:1}  Medications Ordered in ED Medications - No data to display  ED Course/ Medical Decision Making/ A&P                           Medical Decision Making  This patient presents to the ED with concern for ***. This involves an extensive number of treatment options, and is a complaint that carries with it a high risk of complications and morbidity.  The differential diagnosis includes ***  Co-morbidities that complicate the patient evaluation: ***  Additional history obtained from ***  External records from outside source obtained and reviewed including ***  I ordered and personally interpreted labs.  The pertinent results include:  ***  I ordered imaging studies including *** I independently visualized and interpreted imaging which showed *** I agree with the radiologist interpretation  The patient was maintained on a cardiac monitor.  I personally viewed and interpreted the cardiac monitored which showed an underlying rhythm of: ***  Per my interpretation the patient's ECG shows ***  I ordered medication including ***  for *** I have reviewed the patients home medicines and have made adjustments as needed  Test Considered: ***  I requested consultation with the ***,  and discussed lab and imaging findings as well as pertinent plan - they recommend: ***  After the interventions noted above, I reevaluated the patient and found that they have:  {resolved/improved/worsened:23923::"improved"}  Social Determinants of Health:***  Dispostion:  After consideration of the diagnostic results and the patients response to treatment, I feel that the patent would benefit from ***.   {Document critical care time when appropriate:1} {Document review of labs and clinical decision tools ie heart score, Chads2Vasc2 etc:1}  {Document your independent review of radiology images, and any outside records:1} {Document your discussion with family members, caretakers, and with consultants:1} {Document social determinants of health affecting pt's care:1} {Document your decision making why or why not admission, treatments were needed:1} Final Clinical Impression(s) /  ED Diagnoses Final diagnoses:  None    Rx / DC Orders ED Discharge Orders     None

## 2021-10-03 NOTE — ED Triage Notes (Signed)
Pt reports his throat on the left side feels irritated.He feels like something is stuck.  Pt reports his throat does not hurt.

## 2021-10-11 ENCOUNTER — Encounter: Payer: Self-pay | Admitting: Interventional Cardiology

## 2021-10-11 ENCOUNTER — Ambulatory Visit: Payer: BC Managed Care – PPO | Attending: Interventional Cardiology | Admitting: Interventional Cardiology

## 2021-10-11 VITALS — BP 126/76 | HR 77 | Ht 76.0 in | Wt 269.6 lb

## 2021-10-11 DIAGNOSIS — I10 Essential (primary) hypertension: Secondary | ICD-10-CM | POA: Diagnosis not present

## 2021-10-11 DIAGNOSIS — E782 Mixed hyperlipidemia: Secondary | ICD-10-CM

## 2021-10-11 DIAGNOSIS — R072 Precordial pain: Secondary | ICD-10-CM | POA: Diagnosis not present

## 2021-10-11 LAB — CBC
Hematocrit: 46.3 % (ref 37.5–51.0)
Hemoglobin: 15.5 g/dL (ref 13.0–17.7)
MCH: 30.2 pg (ref 26.6–33.0)
MCHC: 33.5 g/dL (ref 31.5–35.7)
MCV: 90 fL (ref 79–97)
Platelets: 310 10*3/uL (ref 150–450)
RBC: 5.13 x10E6/uL (ref 4.14–5.80)
RDW: 14.6 % (ref 11.6–15.4)
WBC: 9.4 10*3/uL (ref 3.4–10.8)

## 2021-10-11 LAB — BASIC METABOLIC PANEL
BUN/Creatinine Ratio: 13 (ref 10–24)
BUN: 17 mg/dL (ref 8–27)
CO2: 26 mmol/L (ref 20–29)
Calcium: 10.5 mg/dL — ABNORMAL HIGH (ref 8.6–10.2)
Chloride: 101 mmol/L (ref 96–106)
Creatinine, Ser: 1.31 mg/dL — ABNORMAL HIGH (ref 0.76–1.27)
Glucose: 87 mg/dL (ref 70–99)
Potassium: 4.6 mmol/L (ref 3.5–5.2)
Sodium: 139 mmol/L (ref 134–144)
eGFR: 60 mL/min/{1.73_m2} (ref >59)

## 2021-10-11 MED ORDER — NITROGLYCERIN 0.4 MG SL SUBL: 0.4000 mg | SUBLINGUAL_TABLET | SUBLINGUAL | 6 refills | Status: AC | PRN

## 2021-10-11 NOTE — H&P (View-Only) (Signed)
Cardiology Office Note   Date:  10/11/2021   ID:  Curtis Duran, DOB November 09, 1955, MRN [id number]  PCP:  Laurey Morale, MD    No chief complaint on file.  Chest pain  Wt Readings from Last 3 Encounters:  10/11/21 269 lb 9.6 oz (122.3 kg)  10/03/21 270 lb (122.5 kg)  07/23/21 269 lb (122 kg)       History of Present Illness: Curtis Duran is a 66 y.o. male who is being seen today for the evaluation of chest pain at the request of Trifan, Carola Rhine, MD.   He was seen in the ER and diagnosed with COVID.  Based on his symptoms, the thought was he had COVID more than 7 days earlier,, the therefore likely in early September.  He did report chest pain as follows: "He reports he sometimes gets chest pains or pressures in the left side of his chest typically worse after he eats.  He does suffer from reflux.  He has a history of hypertension, hyperlipidemia, diabetes.  He is not a smoker.  There is a family history of MI in both of his brothers, both after the age of 26.  The patient has never had a cardiac work-up."  He is here for further evaluation.  Past Medical History:  Diagnosis Date   Anxiety    Bronchial pneumonia    back in January   Carpal tunnel syndrome, bilateral    Depression    Diabetes mellitus without complication ([place])    Eczema    FRONT OF CHEST, MIDDLE OF BACK, FRONT OF LEGS   GERD (gastroesophageal reflux disease)    Glaucoma    se[facility] Dr. Camille Bal at [facility].   Heart murmur    as child    Hyperlipidemia    Lightheadedness    Low back pain    Sleep apnea    wears cpap     Past Surgical History:  Procedure Laterality Date   CERVICAL SPINE SURGERY  2014   per Dr. Ashok Pall   COLONOSCOPY  03/19/2018   per Dr. Loletha Carrow, multiple adenomatous polyps,repeat in 3 yrs    LIPOMA EXCISION N/A 12/01/2015   Procedure: EXCISION OF LIPOMA OF BACK;  Surgeon: Erroll Luna, MD;  Location: [facility];  Service: General;  Laterality: N/A;   POLYPECTOMY        Current Outpatient Medications  Medication Sig Dispense Refill   amLODipine (NORVASC) 5 MG tablet TAKE 1 TABLET BY MOUTH EVERY DAY 90 tablet 1   atorvastatin (LIPITOR) 10 MG tablet Take 1 tablet (10 mg total) by mouth daily. 90 tablet 3   buPROPion (WELLBUTRIN XL) 300 MG 24 hr tablet TAKE 1 TABLET BY MOUTH EVERY DAY 90 tablet 0   celecoxib (CELEBREX) 200 MG capsule TAKE 1 CAPSULE BY MOUTH TWICE A DAY 180 capsule 3   cyclobenzaprine (FLEXERIL) 10 MG tablet Take 1 tablet (10 mg total) by mouth 3 (three) times daily as needed for muscle spasms. 60 tablet 1   diclofenac (VOLTAREN) 75 MG EC tablet Take 1 tablet by mouth 2 (two) times daily as needed.     dorzolamide-timolol (COSOPT) 22.3-6.8 MG/ML ophthalmic solution Place 1 drop into both eyes daily. 10 mL 0   DULoxetine (CYMBALTA) 30 MG capsule TAKE 1 CAPSULE BY MOUTH EVERY DAY 90 capsule 1   hydrOXYzine (ATARAX) 25 MG tablet Take 25 mg by mouth at bedtime.     latanoprost (XALATAN) 0.005 % ophthalmic solution Place 1  drop into both eyes at bedtime.     losartan-hydrochlorothiazide (HYZAAR) 50-12.5 MG tablet Take 1 tablet by mouth daily. 90 tablet 3   metFORMIN (GLUCOPHAGE) 500 MG tablet TAKE 1 TABLET BY MOUTH 2 TIMES DAILY WITH A MEAL. 180 tablet 0   nitroGLYCERIN (NITROSTAT) 0.4 MG SL tablet Place 1 tablet (0.4 mg total) under the tongue every 5 (five) minutes as needed for chest pain. 25 tablet 6   omeprazole (PRILOSEC) 40 MG capsule TAKE 1 CAPSULE BY MOUTH TWICE A DAY 180 capsule 0   PRESCRIPTION MEDICATION Rhopresso 2.5 mg 1 gtt both eyes daily     RHOPRESSA 0.02 % SOLN Apply 1 drop to eye daily.     cetirizine (ZYRTEC) 10 MG tablet Take 10 mg by mouth daily. (Patient not taking: Reported on 10/11/2021)     methylPREDNISolone (MEDROL DOSEPAK) 4 MG TBPK tablet As directed (Patient not taking: Reported on 10/11/2021) 21 tablet 0   No current facility-administered medications for this visit.    Allergies:   Citalopram hydrobromide and  Crestor [rosuvastatin]    Social History:  The patient  reports that he has never smoked. He has never used smokeless tobacco. He reports that he does not drink alcohol and does not use drugs.   Family History:  The patient's family history includes Cancer in his father; Diabetes in an other family member.    ROS:  Please see the history of present illness.   Otherwise, review of systems are positive for increasing chest discomfort when mowing the lawn.   All other systems are reviewed and negative.    PHYSICAL EXAM: VS:  BP 126/76   Pulse 77   Ht '6\' 4"'$  (1.93 m)   Wt 269 lb 9.6 oz (122.3 kg)   SpO2 99%   BMI 32.82 kg/m  , BMI Body mass index is 32.82 kg/m. GEN: Well nourished, well developed, in no acute distress HEENT: normal Neck: no JVD, carotid bruits, or masses Cardiac: RRR; no murmurs, rubs, or gallops,no edema  Respiratory:  clear to auscultation bilaterally, normal work of breathing GI: soft, nontender, nondistended, + BS MS: no deformity or atrophy Skin: warm and dry, no rash Neuro:  Strength and sensation are intact Psych: euthymic mood, full affect   EKG:   The ekg ordered 9/17 demonstrates sinus tach   Recent Labs: 03/09/2021: ALT 35; BUN 19; Creatinine, Ser 1.30; Hemoglobin 15.1; Platelets 305.0; Potassium 4.5; Sodium 138; TSH 3.67   Lipid Panel    Component Value Date/Time   CHOL 159 03/09/2021 0847   TRIG 124.0 03/09/2021 0847   HDL 61.80 03/09/2021 0847   CHOLHDL 3 03/09/2021 0847   VLDL 24.8 03/09/2021 0847   LDLCALC 73 03/09/2021 0847   LDLDIRECT 169.6 03/07/2011 1050     Other studies Reviewed: Additional studies/ records that were reviewed today with results demonstrating: Creatinine 1.3, LDL 73.   ASSESSMENT AND PLAN:  Chest pain: Different triggers.  Most significant symptoms noted with mowing the lawn.  He has had to decrease his continuous mowing times due to chest discomfort.  Class III angina.  He is taking frequent breaks.  Occasional  diaphoresis.  We discussed several strategies but ultimately decided on cardiac catheterization as noted below.  We discussed the possibilities of medical therapy versus PCI versus CABG. Hyperlipidemia: In February 2023 total cholesterol 159 HDL 62 LDL 73 triglycerides 124.  COntinue atorvastatin.  If he does have severe CAD, we will have to increase the intensity of his statin therapy.  Hypertension: The current medical regimen is effective;  continue present plan and medications. Recent COVID infection: DM: A1c 6.6 in February 2023 Family h/o CAD: MIs in two brothers.   Given persistence of symptoms of chest discomfort with mowing the lawn, having to stop after shorter periods of time, we decided on cardiac catheterization.  All questions about cardiac catheterization were answered.  The patient understands that risks include but are not limited to stroke (1 in 1000), death (1 in 74), kidney failure [usually temporary] (1 in 500), bleeding (1 in 200), allergic reaction [possibly serious] (1 in 200), and agrees to proceed.    Current medicines are reviewed at length with the patient today.  The patient concerns regarding his medicines were addressed.  The following changes have been made:  Add SL NTG  Labs/ tests ordered today include:   Orders Placed This Encounter  Procedures   Basic Metabolic Panel (BMET)   CBC    Recommend 150 minutes/week of aerobic exercise Low fat, low carb, high fiber diet recommended  Disposition:   FU in 1 month   Signed, Larae Grooms, MD  10/11/2021 3:26 PM    [facility] [place], [place], [place]  [address] Phone: [phone]; Fax: [phone]

## 2021-10-11 NOTE — Patient Instructions (Addendum)
Medication Instructions:  Your physician recommends that you continue on your current medications as directed. Please refer to the Current Medication list given to you today. A prescription for nitroglycerin to use as needed has been sent to your pharmacy *If you need a refill on your cardiac medications before your next appointment, please call your pharmacy*   Lab Work: Lab work to be done today -BMP and CBC If you have labs (blood work) drawn today and your tests are completely normal, you will receive your results only by: [place] (if you have MyChart) OR A paper copy in the mail If you have any lab test that is abnormal or we need to change your treatment, we will call you to review the results.   Testing/Procedures: Your physician has requested that you have a cardiac catheterization. Cardiac catheterization is used to diagnose and/or treat various heart conditions. Doctors may recommend this procedure for a number of different reasons. The most common reason is to evaluate chest pain. Chest pain can be a symptom of coronary artery disease (CAD), and cardiac catheterization can show whether plaque is narrowing or blocking your heart's arteries. This procedure is also used to evaluate the valves, as well as measure the blood flow and oxygen levels in different parts of your heart. For further information please visit [email or web address]. Please follow instruction sheet, as given. Scheduled for October 14, 2021   Follow-Up: At [facility], you and your health needs are our priority.  As part of our continuing mission to provide you with exceptional heart care, we have created designated Provider Care Teams.  These Care Teams include your primary Cardiologist (physician) and Advanced Practice Providers (APPs -  Physician Assistants and Nurse Practitioners) who all work together to provide you with the care you need, when you need it.  We recommend signing up for the  patient portal called "MyChart".  Sign up information is provided on this After Visit Summary.  MyChart is used to connect with patients for Virtual Visits (Telemedicine).  Patients are able to view lab/test results, encounter notes, upcoming appointments, etc.  Non-urgent messages can be sent to your provider as well.   To learn more about what you can do with MyChart, go to [email or web address].    Your next appointment:   3-4 weeks with APP  The format for your next appointment:   In Person  Provider:  APP   Other Instructions    Cardiac/Peripheral Catheterization   You are scheduled for a Cardiac Catheterization on Thursday, September 28 with Dr. Larae Grooms.  1. Please arrive at the Main Entrance A at [facility]: [place], [place] [address] on September 28 at 5:30 AM (This time is two hours before your procedure to ensure your preparation). Free valet parking service is available. You will check in at ADMITTING. The support person will be asked to wait in the waiting room.  It is OK to have someone drop you off and come back when you are ready to be discharged.        Special note: Every effort is made to have your procedure done on time. Please understand that emergencies sometimes delay scheduled procedures.   . 2. Diet: Do not eat solid foods after midnight.  You may have clear liquids until 5 AM the day of the procedure.  3. Labs: done in office today.  4. Medication instructions in preparation for your procedure:   Contrast Allergy: No   Do not  take Losartan/HCTZ the day of the procedure   Do not take Diabetes Med Glucophage (Metformin) on the day of the procedure and HOLD 48 HOURS AFTER THE PROCEDURE.  On the morning of your procedure, take Aspirin 81 mg and any morning medicines NOT listed above.  You may use sips of water.  5. Plan to go home the same day, you will only stay overnight if medically necessary. 6. You MUST have a  responsible adult to drive you home. 7. An adult MUST be with you the first 24 hours after you arrive home. 8. Bring a current list of your medications, and the last time and date medication taken. 9. Bring ID and current insurance cards. 10.Please wear clothes that are easy to get on and off and wear slip-on shoes.  Thank you for allowing [place] to care for you!   -- [facility]     Nitroglycerin Sublingual Tablets What is this medication? NITROGLYCERIN (nye troe GLI ser in) prevents and treats chest pain (angina). It works by relaxing blood vessels, which decreases the amount of work the heart has to do. It belongs to a group of medications called nitrates. This medicine may be used for other purposes; ask your health care provider or pharmacist if you have questions. COMMON BRAND NAME(S): Nitroquick, Nitrostat, Nitrotab What should I tell my care team before I take this medication? They need to know if you have any of these conditions: Anemia Head injury, recent stroke, or bleeding in the brain Liver disease Previous heart attack An unusual or allergic reaction to nitroglycerin, other medications, foods, dyes, or preservatives Pregnant or trying to get pregnant Breast-feeding How should I use this medication? Take this medication by mouth as needed. Use at the first sign of an angina attack (chest pain or tightness). You can also take this medication 5 to 10 minutes before an event likely to produce chest pain. Follow the directions exactly as written on the prescription label. Place one tablet under your tongue and let it dissolve. Do not swallow whole. Replace the dose if you accidentally swallow it. It will help if your mouth is not dry. Saliva around the tablet will help it to dissolve more quickly. Do not eat or drink, smoke or chew tobacco while a tablet is dissolving. Sit down when taking this medication. In an angina attack, you should feel better  within 5 minutes after your first dose. You can take a dose every 5 minutes up to a total of 3 doses. If you do not feel better or feel worse after 1 dose, call 9-1-1 at once. Do not take more than 3 doses in 15 minutes. Your care team might give you other directions. Follow those directions if they do. Do not take your medication more often than directed. Talk to your care team about the use of this medication in children. Special care may be needed. Overdosage: If you think you have taken too much of this medicine contact a poison control center or emergency room at once. NOTE: This medicine is only for you. Do not share this medicine with others. What if I miss a dose? This does not apply. This medication is only used as needed. What may interact with this medication? Do not take this medication with any of the following: Certain migraine medications like ergotamine and dihydroergotamine (DHE) Medications used to treat erectile dysfunction like sildenafil, tadalafil, and vardenafil Riociguat This medication may also interact with the following: Alteplase Aspirin Heparin  Medications for high blood pressure Medications for mental depression Other medications used to treat angina Phenothiazines like chlorpromazine, mesoridazine, prochlorperazine, thioridazine This list may not describe all possible interactions. Give your health care provider a list of all the medicines, herbs, non-prescription drugs, or dietary supplements you use. Also tell them if you smoke, drink alcohol, or use illegal drugs. Some items may interact with your medicine. What should I watch for while using this medication? Tell your care team if you feel your medication is no longer working. Keep this medication with you at all times. Sit or lie down when you take your medication to prevent falling if you feel dizzy or faint after using it. Try to remain calm. This will help you to feel better faster. If you feel dizzy, take  several deep breaths and lie down with your feet propped up, or bend forward with your head resting between your knees. You may get drowsy or dizzy. Do not drive, use machinery, or do anything that needs mental alertness until you know how this medication affects you. Do not stand or sit up quickly, especially if you are an older patient. This reduces the risk of dizzy or fainting spells. Alcohol can make you more drowsy and dizzy. Avoid alcoholic drinks. Do not treat yourself for coughs, colds, or pain while you are taking this medication without asking your care team for advice. Some ingredients may increase your blood pressure. What side effects may I notice from receiving this medication? Side effects that you should report to your care team as soon as possible: Allergic reactions--skin rash, itching, hives, swelling of the face, lips, tongue, or throat Headache, unusual weakness or fatigue, shortness of breath, nausea, vomiting, rapid heartbeat, blue skin or lips, which may be signs of methemoglobinemia Increased pressure around the brain--severe headache, blurry vision, change in vision, nausea, vomiting Low blood pressure--dizziness, feeling faint or lightheaded, blurry vision Slow heartbeat--dizziness, feeling faint or lightheaded, confusion, trouble breathing, unusual weakness or fatigue Worsening chest pain (angina)--pain, pressure, or tightness in the chest, neck, back, or arms Side effects that usually do not require medical attention (report to your care team if they continue or are bothersome): Dizziness Flushing Headache This list may not describe all possible side effects. Call your doctor for medical advice about side effects. You may report side effects to FDA at 1-800-FDA-1088. Where should I keep my medication? Keep out of the reach of children. Store at room temperature between 20 and 25 degrees C (68 and 77 degrees F). Store in Chief of Staff. Protect from light and  moisture. Keep tightly closed. Throw away any unused medication after the expiration date. NOTE: This sheet is a summary. It may not cover all possible information. If you have questions about this medicine, talk to your doctor, pharmacist, or health care provider.  2023 Elsevier/Gold Standard (2007-02-24 00:00:00)   Important Information About Sugar

## 2021-10-11 NOTE — Progress Notes (Signed)
Cardiology Office Note   Date:  10/11/2021   ID:  Curtis Duran, DOB 03/02/1955, MRN [id number]  PCP:  Laurey Morale, MD    No chief complaint on file.  Chest pain  Wt Readings from Last 3 Encounters:  10/11/21 269 lb 9.6 oz (122.3 kg)  10/03/21 270 lb (122.5 kg)  07/23/21 269 lb (122 kg)       History of Present Illness: Curtis Duran is a 66 y.o. male who is being seen today for the evaluation of chest pain at the request of Trifan, Carola Rhine, MD.   He was seen in the ER and diagnosed with COVID.  Based on his symptoms, the thought was he had COVID more than 7 days earlier,, the therefore likely in early September.  He did report chest pain as follows: "He reports he sometimes gets chest pains or pressures in the left side of his chest typically worse after he eats.  He does suffer from reflux.  He has a history of hypertension, hyperlipidemia, diabetes.  He is not a smoker.  There is a family history of MI in both of his brothers, both after the age of 10.  The patient has never had a cardiac work-up."  He is here for further evaluation.  Past Medical History:  Diagnosis Date   Anxiety    Bronchial pneumonia    back in January   Carpal tunnel syndrome, bilateral    Depression    Diabetes mellitus without complication ([place])    Eczema    FRONT OF CHEST, MIDDLE OF BACK, FRONT OF LEGS   GERD (gastroesophageal reflux disease)    Glaucoma    se[facility] Dr. Camille Bal at [facility].   Heart murmur    as child    Hyperlipidemia    Lightheadedness    Low back pain    Sleep apnea    wears cpap     Past Surgical History:  Procedure Laterality Date   CERVICAL SPINE SURGERY  2014   per Dr. Ashok Pall   COLONOSCOPY  03/19/2018   per Dr. Loletha Carrow, multiple adenomatous polyps,repeat in 3 yrs    LIPOMA EXCISION N/A 12/01/2015   Procedure: EXCISION OF LIPOMA OF BACK;  Surgeon: Erroll Luna, MD;  Location: [place];  Service: General;  Laterality: N/A;   POLYPECTOMY        Current Outpatient Medications  Medication Sig Dispense Refill   amLODipine (NORVASC) 5 MG tablet TAKE 1 TABLET BY MOUTH EVERY DAY 90 tablet 1   atorvastatin (LIPITOR) 10 MG tablet Take 1 tablet (10 mg total) by mouth daily. 90 tablet 3   buPROPion (WELLBUTRIN XL) 300 MG 24 hr tablet TAKE 1 TABLET BY MOUTH EVERY DAY 90 tablet 0   celecoxib (CELEBREX) 200 MG capsule TAKE 1 CAPSULE BY MOUTH TWICE A DAY 180 capsule 3   cyclobenzaprine (FLEXERIL) 10 MG tablet Take 1 tablet (10 mg total) by mouth 3 (three) times daily as needed for muscle spasms. 60 tablet 1   diclofenac (VOLTAREN) 75 MG EC tablet Take 1 tablet by mouth 2 (two) times daily as needed.     dorzolamide-timolol (COSOPT) 22.3-6.8 MG/ML ophthalmic solution Place 1 drop into both eyes daily. 10 mL 0   DULoxetine (CYMBALTA) 30 MG capsule TAKE 1 CAPSULE BY MOUTH EVERY DAY 90 capsule 1   hydrOXYzine (ATARAX) 25 MG tablet Take 25 mg by mouth at bedtime.     latanoprost (XALATAN) 0.005 % ophthalmic solution Place 1  drop into both eyes at bedtime.     losartan-hydrochlorothiazide (HYZAAR) 50-12.5 MG tablet Take 1 tablet by mouth daily. 90 tablet 3   metFORMIN (GLUCOPHAGE) 500 MG tablet TAKE 1 TABLET BY MOUTH 2 TIMES DAILY WITH A MEAL. 180 tablet 0   nitroGLYCERIN (NITROSTAT) 0.4 MG SL tablet Place 1 tablet (0.4 mg total) under the tongue every 5 (five) minutes as needed for chest pain. 25 tablet 6   omeprazole (PRILOSEC) 40 MG capsule TAKE 1 CAPSULE BY MOUTH TWICE A DAY 180 capsule 0   PRESCRIPTION MEDICATION Rhopresso 2.5 mg 1 gtt both eyes daily     RHOPRESSA 0.02 % SOLN Apply 1 drop to eye daily.     cetirizine (ZYRTEC) 10 MG tablet Take 10 mg by mouth daily. (Patient not taking: Reported on 10/11/2021)     methylPREDNISolone (MEDROL DOSEPAK) 4 MG TBPK tablet As directed (Patient not taking: Reported on 10/11/2021) 21 tablet 0   No current facility-administered medications for this visit.    Allergies:   Citalopram hydrobromide and  Crestor [rosuvastatin]    Social History:  The patient  reports that he has never smoked. He has never used smokeless tobacco. He reports that he does not drink alcohol and does not use drugs.   Family History:  The patient's family history includes Cancer in his father; Diabetes in an other family member.    ROS:  Please see the history of present illness.   Otherwise, review of systems are positive for increasing chest discomfort when mowing the lawn.   All other systems are reviewed and negative.    PHYSICAL EXAM: VS:  BP 126/76   Pulse 77   Ht '6\' 4"'$  (1.93 m)   Wt 269 lb 9.6 oz (122.3 kg)   SpO2 99%   BMI 32.82 kg/m  , BMI Body mass index is 32.82 kg/m. GEN: Well nourished, well developed, in no acute distress HEENT: normal Neck: no JVD, carotid bruits, or masses Cardiac: RRR; no murmurs, rubs, or gallops,no edema  Respiratory:  clear to auscultation bilaterally, normal work of breathing GI: soft, nontender, nondistended, + BS MS: no deformity or atrophy Skin: warm and dry, no rash Neuro:  Strength and sensation are intact Psych: euthymic mood, full affect   EKG:   The ekg ordered 9/17 demonstrates sinus tach   Recent Labs: 03/09/2021: ALT 35; BUN 19; Creatinine, Ser 1.30; Hemoglobin 15.1; Platelets 305.0; Potassium 4.5; Sodium 138; TSH 3.67   Lipid Panel    Component Value Date/Time   CHOL 159 03/09/2021 0847   TRIG 124.0 03/09/2021 0847   HDL 61.80 03/09/2021 0847   CHOLHDL 3 03/09/2021 0847   VLDL 24.8 03/09/2021 0847   LDLCALC 73 03/09/2021 0847   LDLDIRECT 169.6 03/07/2011 1050     Other studies Reviewed: Additional studies/ records that were reviewed today with results demonstrating: Creatinine 1.3, LDL 73.   ASSESSMENT AND PLAN:  Chest pain: Different triggers.  Most significant symptoms noted with mowing the lawn.  He has had to decrease his continuous mowing times due to chest discomfort.  Class III angina.  He is taking frequent breaks.  Occasional  diaphoresis.  We discussed several strategies but ultimately decided on cardiac catheterization as noted below.  We discussed the possibilities of medical therapy versus PCI versus CABG. Hyperlipidemia: In February 2023 total cholesterol 159 HDL 62 LDL 73 triglycerides 124.  COntinue atorvastatin.  If he does have severe CAD, we will have to increase the intensity of his statin therapy.  Hypertension: The current medical regimen is effective;  continue present plan and medications. Recent COVID infection: DM: A1c 6.6 in February 2023 Family h/o CAD: MIs in two brothers.   Given persistence of symptoms of chest discomfort with mowing the lawn, having to stop after shorter periods of time, we decided on cardiac catheterization.  All questions about cardiac catheterization were answered.  The patient understands that risks include but are not limited to stroke (1 in 1000), death (1 in 66), kidney failure [usually temporary] (1 in 500), bleeding (1 in 200), allergic reaction [possibly serious] (1 in 200), and agrees to proceed.    Current medicines are reviewed at length with the patient today.  The patient concerns regarding his medicines were addressed.  The following changes have been made:  Add SL NTG  Labs/ tests ordered today include:   Orders Placed This Encounter  Procedures   Basic Metabolic Panel (BMET)   CBC    Recommend 150 minutes/week of aerobic exercise Low fat, low carb, high fiber diet recommended  Disposition:   FU in 1 month   Signed, Larae Grooms, MD  10/11/2021 3:26 PM    [facility] [place], [place], [place]  [address] Phone: [phone]; Fax: [phone]

## 2021-10-12 ENCOUNTER — Telehealth: Payer: Self-pay | Admitting: *Deleted

## 2021-10-12 NOTE — Telephone Encounter (Signed)
Cardiac Catheterization scheduled at [facility] for: Thursday October 13, 2021 7:30 AM Arrival time and place: [place] Entrance A at: 5:30 AM  Nothing to eat after midnight prior to procedure, clear liquids until 5 AM day of procedure.  Medication instructions: -Usual morning medications can be taken with sips of water including aspirin 81 mg.  Confirmed patient has responsible adult to drive home post procedure and be with patient first 24 hours after arriving home.  Patient reports no new symptoms concerning for COVID-19 in the past 10 days.  Reviewed procedure instructions with patient.

## 2021-10-12 NOTE — Telephone Encounter (Signed)
Patient reports he tested positive for COVID-19 on 10/03/21.  Patient reports he is afebrile, symptoms have resolved, knows to wear mask around others.

## 2021-10-14 ENCOUNTER — Encounter (HOSPITAL_COMMUNITY): Payer: Self-pay | Admitting: Interventional Cardiology

## 2021-10-14 ENCOUNTER — Ambulatory Visit (HOSPITAL_COMMUNITY)
Admission: RE | Disposition: A | Discharge status: Home / Self Care | Payer: Self-pay | Source: Home / Self Care | Attending: Interventional Cardiology

## 2021-10-14 ENCOUNTER — Other Ambulatory Visit: Payer: Self-pay

## 2021-10-14 ENCOUNTER — Ambulatory Visit (HOSPITAL_COMMUNITY)
Admission: RE | Admit: 2021-10-14 | Discharge: 2021-10-14 | Disposition: A | Discharge status: Home / Self Care | Payer: BC Managed Care – PPO | Attending: Interventional Cardiology | Admitting: Interventional Cardiology

## 2021-10-14 DIAGNOSIS — Z79899 Other long term (current) drug therapy: Secondary | ICD-10-CM | POA: Diagnosis not present

## 2021-10-14 DIAGNOSIS — E119 Type 2 diabetes mellitus without complications: Secondary | ICD-10-CM | POA: Diagnosis not present

## 2021-10-14 DIAGNOSIS — R072 Precordial pain: Secondary | ICD-10-CM | POA: Diagnosis not present

## 2021-10-14 DIAGNOSIS — Z8616 Personal history of COVID-19: Secondary | ICD-10-CM | POA: Diagnosis not present

## 2021-10-14 DIAGNOSIS — E785 Hyperlipidemia, unspecified: Secondary | ICD-10-CM | POA: Diagnosis not present

## 2021-10-14 DIAGNOSIS — Z7984 Long term (current) use of oral hypoglycemic drugs: Secondary | ICD-10-CM | POA: Diagnosis not present

## 2021-10-14 DIAGNOSIS — Z8249 Family history of ischemic heart disease and other diseases of the circulatory system: Secondary | ICD-10-CM | POA: Diagnosis not present

## 2021-10-14 DIAGNOSIS — I209 Angina pectoris, unspecified: Secondary | ICD-10-CM | POA: Insufficient documentation

## 2021-10-14 DIAGNOSIS — I10 Essential (primary) hypertension: Secondary | ICD-10-CM | POA: Insufficient documentation

## 2021-10-14 DIAGNOSIS — R079 Chest pain, unspecified: Secondary | ICD-10-CM | POA: Diagnosis present

## 2021-10-14 HISTORY — PX: LEFT HEART CATH AND CORONARY ANGIOGRAPHY: CATH118249

## 2021-10-14 LAB — GLUCOSE, CAPILLARY
Glucose-Capillary: 129 mg/dL — ABNORMAL HIGH (ref 70–99)
Glucose-Capillary: 110 mg/dL — ABNORMAL HIGH (ref 70–99)

## 2021-10-14 SURGERY — LEFT HEART CATH AND CORONARY ANGIOGRAPHY
Anesthesia: LOCAL

## 2021-10-14 MED ORDER — HEPARIN SODIUM (PORCINE) 1000 UNIT/ML IJ SOLN
INTRAMUSCULAR | Status: AC
  Filled 2021-10-14: qty 10

## 2021-10-14 MED ORDER — HEPARIN (PORCINE) IN NACL 1000-0.9 UT/500ML-% IV SOLN
INTRAVENOUS | Status: AC
  Filled 2021-10-14: qty 1000

## 2021-10-14 MED ORDER — HEPARIN (PORCINE) IN NACL 1000-0.9 UT/500ML-% IV SOLN
INTRAVENOUS | Status: DC | PRN
  Administered 2021-10-14 (×2): 500 mL

## 2021-10-14 MED ORDER — VERAPAMIL HCL 2.5 MG/ML IV SOLN
INTRAVENOUS | Status: DC | PRN
  Administered 2021-10-14: 10 mL via INTRA_ARTERIAL

## 2021-10-14 MED ORDER — MIDAZOLAM HCL 2 MG/2ML IJ SOLN
INTRAMUSCULAR | Status: AC
  Filled 2021-10-14: qty 2

## 2021-10-14 MED ORDER — SODIUM CHLORIDE 0.9 % IV SOLN: 250.0000 mL | INTRAVENOUS | Status: DC | PRN

## 2021-10-14 MED ORDER — SODIUM CHLORIDE 0.9% FLUSH: 3.0000 mL | Freq: Two times a day (BID) | INTRAVENOUS | Status: DC

## 2021-10-14 MED ORDER — HEPARIN SODIUM (PORCINE) 1000 UNIT/ML IJ SOLN
INTRAMUSCULAR | Status: DC | PRN
  Administered 2021-10-14: 5000 [IU] via INTRAVENOUS

## 2021-10-14 MED ORDER — SODIUM CHLORIDE 0.9 % IV SOLN: INTRAVENOUS | Status: AC

## 2021-10-14 MED ORDER — ONDANSETRON HCL 4 MG/2ML IJ SOLN: 4.0000 mg | Freq: Four times a day (QID) | INTRAMUSCULAR | Status: DC | PRN

## 2021-10-14 MED ORDER — SODIUM CHLORIDE 0.9% FLUSH: 3.0000 mL | INTRAVENOUS | Status: DC | PRN

## 2021-10-14 MED ORDER — HYDRALAZINE HCL 20 MG/ML IJ SOLN: 10.0000 mg | INTRAMUSCULAR | Status: DC | PRN

## 2021-10-14 MED ORDER — MIDAZOLAM HCL 2 MG/2ML IJ SOLN
INTRAMUSCULAR | Status: DC | PRN
  Administered 2021-10-14: 2 mg via INTRAVENOUS
  Administered 2021-10-14: 1 mg via INTRAVENOUS

## 2021-10-14 MED ORDER — SODIUM CHLORIDE 0.9 % WEIGHT BASED INFUSION
3.0000 mL/kg/h | INTRAVENOUS | Status: AC
  Administered 2021-10-14: 3 mL/kg/h via INTRAVENOUS

## 2021-10-14 MED ORDER — LIDOCAINE HCL (PF) 1 % IJ SOLN
INTRAMUSCULAR | Status: AC
  Filled 2021-10-14: qty 30

## 2021-10-14 MED ORDER — ASPIRIN 81 MG PO CHEW: 81.0000 mg | CHEWABLE_TABLET | ORAL | Status: AC

## 2021-10-14 MED ORDER — ACETAMINOPHEN 325 MG PO TABS: 650.0000 mg | ORAL_TABLET | ORAL | Status: DC | PRN

## 2021-10-14 MED ORDER — FENTANYL CITRATE (PF) 100 MCG/2ML IJ SOLN
INTRAMUSCULAR | Status: AC
  Filled 2021-10-14: qty 2

## 2021-10-14 MED ORDER — FENTANYL CITRATE (PF) 100 MCG/2ML IJ SOLN
INTRAMUSCULAR | Status: DC | PRN
  Administered 2021-10-14: 25 ug via INTRAVENOUS
  Administered 2021-10-14: 50 ug via INTRAVENOUS

## 2021-10-14 MED ORDER — LABETALOL HCL 5 MG/ML IV SOLN: 10.0000 mg | INTRAVENOUS | Status: DC | PRN

## 2021-10-14 MED ORDER — IOHEXOL 350 MG/ML SOLN
INTRAVENOUS | Status: DC | PRN
  Administered 2021-10-14: 90 mL

## 2021-10-14 MED ORDER — SODIUM CHLORIDE 0.9 % WEIGHT BASED INFUSION: 1.0000 mL/kg/h | INTRAVENOUS | Status: DC

## 2021-10-14 MED ORDER — VERAPAMIL HCL 2.5 MG/ML IV SOLN
INTRAVENOUS | Status: AC
  Filled 2021-10-14: qty 2

## 2021-10-14 MED ORDER — METFORMIN HCL 500 MG PO TABS: 500.0000 mg | ORAL_TABLET | Freq: Two times a day (BID) | ORAL | 0 refills | Status: DC

## 2021-10-14 SURGICAL SUPPLY — 14 items
BAND CMPR LRG ZPHR (HEMOSTASIS) ×1
BAND ZEPHYR COMPRESS 30 LONG (HEMOSTASIS) IMPLANT
CATH 5FR JL3.5 JR4 ANG PIG MP (CATHETERS) IMPLANT
CATH LAUNCHER 5F JL3 (CATHETERS) IMPLANT
CATHETER LAUNCHER 5F JL3 (CATHETERS) ×1
GLIDESHEATH SLEND SS 6F .021 (SHEATH) IMPLANT
GUIDEWIRE INQWIRE 1.5J.035X260 (WIRE) IMPLANT
INQWIRE 1.5J .035X260CM (WIRE) ×1
KIT SYRINGE INJ CVI SPIKEX1 (MISCELLANEOUS) IMPLANT
PACK CARDIAC CATHETERIZATION (CUSTOM PROCEDURE TRAY) ×1 IMPLANT
PROTECTION STATION PRESSURIZED (MISCELLANEOUS) ×1
SET ATX SIMPLICITY (MISCELLANEOUS) IMPLANT
SHEATH PROBE COVER 6X72 (BAG) IMPLANT
STATION PROTECTION PRESSURIZED (MISCELLANEOUS) IMPLANT

## 2021-10-14 NOTE — Interval H&P Note (Signed)
Cath Lab Visit (complete for each Cath Lab visit)  Clinical Evaluation Leading to the Procedure:   ACS: No.  Non-ACS:    Anginal Classification: CCS III  Anti-ischemic medical therapy: Minimal Therapy (1 class of medications)  Non-Invasive Test Results: No non-invasive testing performed  Prior CABG: No previous CABG      History and Physical Interval Note:  10/14/2021 7:40 AM  Curtis Duran  has presented today for surgery, with the diagnosis of chest pain.  The various methods of treatment have been discussed with the patient and family. After consideration of risks, benefits and other options for treatment, the patient has consented to  Procedure(s): LEFT HEART CATH AND CORONARY ANGIOGRAPHY (N/A) as a surgical intervention.  The patient's history has been reviewed, patient examined, no change in status, stable for surgery.  I have reviewed the patient's chart and labs.  Questions were answered to the patient's satisfaction.     Larae Grooms

## 2021-10-14 NOTE — Discharge Instructions (Signed)

## 2021-10-15 MED FILL — Lidocaine HCl Local Preservative Free (PF) Inj 1%: INTRAMUSCULAR | Qty: 30 | Status: AC

## 2021-10-21 ENCOUNTER — Other Ambulatory Visit: Payer: Self-pay | Admitting: Family Medicine

## 2021-10-21 ENCOUNTER — Telehealth: Payer: Self-pay | Admitting: Family Medicine

## 2021-10-21 NOTE — Telephone Encounter (Signed)
Pt last OV 07/23/2021. On 10/16/21 Dr. Irish Lack placed a refill for patient it stated No Print.   Okay to send for refill

## 2021-10-21 NOTE — Telephone Encounter (Signed)
Pt last seen dr Sarajane Jews 07-23-2021 and need a refill on metFORMIN (GLUCOPHAGE) 500 MG tablet  [id number] [place] [address]  [phone] Fax:  [phone]

## 2021-10-22 ENCOUNTER — Ambulatory Visit (INDEPENDENT_AMBULATORY_CARE_PROVIDER_SITE_OTHER): Payer: BC Managed Care – PPO

## 2021-10-22 ENCOUNTER — Other Ambulatory Visit: Payer: Self-pay

## 2021-10-22 DIAGNOSIS — Z23 Encounter for immunization: Secondary | ICD-10-CM

## 2021-10-22 DIAGNOSIS — E119 Type 2 diabetes mellitus without complications: Secondary | ICD-10-CM

## 2021-10-22 MED ORDER — METFORMIN HCL 500 MG PO TABS: 500.0000 mg | ORAL_TABLET | Freq: Two times a day (BID) | ORAL | 3 refills | Status: DC

## 2021-10-22 NOTE — Telephone Encounter (Signed)
Please refill this for a year

## 2021-10-22 NOTE — Telephone Encounter (Signed)
1 year supply for Metformin [facility] mg [facility] [address].    Called pt lvm with this information.

## 2021-10-23 ENCOUNTER — Other Ambulatory Visit: Payer: Self-pay | Admitting: Family Medicine

## 2021-10-30 ENCOUNTER — Other Ambulatory Visit: Payer: Self-pay | Admitting: Family Medicine

## 2021-10-30 DIAGNOSIS — I10 Essential (primary) hypertension: Secondary | ICD-10-CM

## 2021-11-07 NOTE — Progress Notes (Unsigned)
Office Visit    Patient Name: Curtis Duran Date of Encounter: 11/08/2021  PCP:  Laurey Morale, MD   [place]  Cardiologist:  Larae Grooms, MD  Advanced Practice Provider:  No care team member to display Electrophysiologist:  None   HPI    Curtis Duran is a 66 y.o. male with a past medical history significant for chest pain, COVID-19, anxiety, diabetes mellitus, depression, hyperlipidemia, sleep apnea presents today for follow-up appointment.  He was last seen 10/11/2021.  He was seen for evaluation of chest pain.  He was recently in the ER and diagnosed with COVID.  He did report chest pain or pressure in the left side of his chest typically worse after he eats.  He does not suffer from reflux.  He has history of hypertension, hyperlipidemia, and diabetes.  He is not a smoker.  There is family history of MI in both his brothers, both after the age of 87.  Patient had never been in for cardiac work-up.  Ultimately decided on cardiac catheterization.  Today, he states that he was sitting at home and woke up 1 morning and the chest pain was there.  He plated off for a while but it stayed there.  He was having some shortness of breath that was associated with it.  He went to the ED to get checked out.  Cardiac catheterization showed no obstructive CAD.  He does occasionally have some lightheadednes  But this is not associated with the pain.  He is concerned because he has a strong family history for cardiac disease.  No edema, orthopnea, PND. Reports no palpitations.   Past Medical History    Past Medical History:  Diagnosis Date   Anxiety    Bronchial pneumonia    back in January   Carpal tunnel syndrome, bilateral    Depression    Diabetes mellitus without complication ([place])    Eczema    FRONT OF CHEST, MIDDLE OF BACK, FRONT OF LEGS   GERD (gastroesophageal reflux disease)    Glaucoma    sees Dr. Camille Bal at [facility].   Heart murmur     as child    Hyperlipidemia    Lightheadedness    Low back pain    Sleep apnea    wears cpap    Past Surgical History:  Procedure Laterality Date   CERVICAL SPINE SURGERY  2014   per Dr. Ashok Pall   COLONOSCOPY  03/19/2018   per Dr. Loletha Carrow, multiple adenomatous polyps,repeat in 3 yrs    LEFT HEART CATH AND CORONARY ANGIOGRAPHY N/A 10/14/2021   Procedure: LEFT HEART CATH AND CORONARY ANGIOGRAPHY;  Surgeon: Jettie Booze, MD;  Location: [place] [facility];  Service: Cardiovascular;  Laterality: N/A;   LIPOMA EXCISION N/A 12/01/2015   Procedure: EXCISION OF LIPOMA OF BACK;  Surgeon: Erroll Luna, MD;  Location: [place];  Service: General;  Laterality: N/A;   POLYPECTOMY      Allergies  Allergies  Allergen Reactions   Citalopram Hydrobromide Rash   Crestor [Rosuvastatin] Rash   Other Rash    C[place]     EKGs/Labs/Other Studies Reviewed:   The following studies were reviewed today:  Cardiac catheterization 10/14/2021  Left Anterior Descending  The vessel exhibits minimal luminal irregularities.    Right Coronary Artery  Vessel is small. The vessel exhibits minimal luminal irregularities.    Intervention   No interventions have been documented.   Wall Motion  Left Heart  Left Ventricle The left ventricular size is normal. The left ventricular systolic function is normal. LV end diastolic pressure is mildly elevated. The left ventricular ejection fraction is 55-65% by visual estimate. No regional wall motion abnormalities.  Aortic Valve There is no aortic valve stenosis.   Coronary Diagrams  Diagnostic Dominance: Left  Intervention   EKG:  EKG is  ordered today.  The ekg ordered today demonstrates sinus tachycardia with premature atrial complexes, rate 103 bpm  Recent Labs: 03/09/2021: ALT 35; TSH 3.67 10/11/2021: BUN 17; Creatinine, Ser 1.31; Hemoglobin 15.5; Platelets 310; Potassium 4.6; Sodium 139  Recent Lipid Panel    Component  Value Date/Time   CHOL 159 03/09/2021 0847   TRIG 124.0 03/09/2021 0847   HDL 61.80 03/09/2021 0847   CHOLHDL 3 03/09/2021 0847   VLDL 24.8 03/09/2021 0847   LDLCALC 73 03/09/2021 0847   LDLDIRECT 169.6 03/07/2011 1050    Home Medications   Current Meds  Medication Sig   amLODipine (NORVASC) 5 MG tablet TAKE 1 TABLET BY MOUTH EVERY DAY   aspirin EC 81 MG tablet Take 81 mg by mouth daily. Swallow whole.   atorvastatin (LIPITOR) 10 MG tablet Take 1 tablet (10 mg total) by mouth daily.   buPROPion (WELLBUTRIN XL) 300 MG 24 hr tablet TAKE 1 TABLET BY MOUTH EVERY DAY   celecoxib (CELEBREX) 200 MG capsule TAKE 1 CAPSULE BY MOUTH TWICE A DAY   cholecalciferol (VITAMIN D3) 25 MCG (1000 UNIT) tablet Take 1,000 Units by mouth daily.   cyclobenzaprine (FLEXERIL) 10 MG tablet Take 1 tablet (10 mg total) by mouth 3 (three) times daily as needed for muscle spasms.   dorzolamide-timolol (COSOPT) 22.3-6.8 MG/ML ophthalmic solution Place 1 drop into both eyes daily.   DULoxetine (CYMBALTA) 30 MG capsule TAKE 1 CAPSULE BY MOUTH EVERY DAY   hydrOXYzine (ATARAX) 25 MG tablet Take 25 mg by mouth at bedtime.   latanoprost (XALATAN) 0.005 % ophthalmic solution Place 1 drop into both eyes at bedtime.   losartan-hydrochlorothiazide (HYZAAR) 50-12.5 MG tablet Take 1 tablet by mouth daily.   metFORMIN (GLUCOPHAGE) 500 MG tablet Take 1 tablet (500 mg total) by mouth 2 (two) times daily with a meal.   metoprolol tartrate (LOPRESSOR) 25 MG tablet Take 0.5 tablets (12.5 mg total) by mouth 2 (two) times daily.   nitroGLYCERIN (NITROSTAT) 0.4 MG SL tablet Place 1 tablet (0.4 mg total) under the tongue every 5 (five) minutes as needed for chest pain.   omeprazole (PRILOSEC) 40 MG capsule TAKE 1 CAPSULE BY MOUTH TWICE A DAY   RHOPRESSA 0.02 % SOLN Place 1 drop into both eyes every morning.     Review of Systems      All other systems reviewed and are otherwise negative except as noted above.  Physical Exam     VS:  BP (!) 142/72   Pulse (!) 114   Ht '6\' 4"'$  (1.93 m)   Wt 274 lb 6.4 oz (124.5 kg)   SpO2 98%   BMI 33.40 kg/m  , BMI Body mass index is 33.4 kg/m.  Wt Readings from Last 3 Encounters:  11/08/21 274 lb 6.4 oz (124.5 kg)  10/14/21 270 lb (122.5 kg)  10/11/21 269 lb 9.6 oz (122.3 kg)     GEN: Well nourished, well developed, in no acute distress. HEENT: normal. Neck: Supple, no JVD, carotid bruits, or masses. Cardiac: irregular irregular, no murmurs, rubs, or gallops. No clubbing, cyanosis, edema.  Radials/PT 2+ and equal  bilaterally.  Respiratory:  Respirations regular and unlabored, clear to auscultation bilaterally. GI: Soft, nontender, nondistended. MS: No deformity or atrophy. Skin: Warm and dry, no rash. Neuro:  Strength and sensation are intact. Psych: Normal affect.  Assessment & Plan    Chest pain with no significant CAD -Continue GDMT add ASA 81 mg, Lipitor 10 mg, Norvasc 5 mg daily, Hyzaar 50-12.5 mg daily, and as needed nitro   Hyperlipidemia -Most recent lipid panel 2/23 showed LDL 73, HDL 61, total cholesterol 159, triglycerides 124 -Continue Lipitor 10 mg daily  Irregular heartbeat -EKG obtained today which showed sinus tachycardia with PACs -We have started him on metoprolol tartrate 12.5 mg twice daily -Monitor closely for possible atrial fibrillation  Hypertension -slightly elevated in the office today, monitor at home once daily x 2 weeks and send me those values -Continue on Norvasc 5 mg daily and Hyzaar 50-12.5 mg daily  Recent COVID infection -still recovering  Diabetes mellitus -Most recent A1c 6.6 -Continue metformin 500 twice daily -per PCP  Family history of CAD -continue current medication regimen     Disposition: Follow up 3 months with Larae Grooms, MD or APP.  Signed, Elgie Collard, PA-C 11/08/2021, 1:07 PM [place]

## 2021-11-08 ENCOUNTER — Ambulatory Visit: Payer: BC Managed Care – PPO | Attending: Physician Assistant | Admitting: Physician Assistant

## 2021-11-08 ENCOUNTER — Encounter: Payer: Self-pay | Admitting: Physician Assistant

## 2021-11-08 VITALS — BP 142/72 | HR 114 | Ht 76.0 in | Wt 274.4 lb

## 2021-11-08 DIAGNOSIS — E119 Type 2 diabetes mellitus without complications: Secondary | ICD-10-CM

## 2021-11-08 DIAGNOSIS — R072 Precordial pain: Secondary | ICD-10-CM

## 2021-11-08 DIAGNOSIS — I10 Essential (primary) hypertension: Secondary | ICD-10-CM

## 2021-11-08 DIAGNOSIS — E782 Mixed hyperlipidemia: Secondary | ICD-10-CM | POA: Diagnosis not present

## 2021-11-08 DIAGNOSIS — Z8249 Family history of ischemic heart disease and other diseases of the circulatory system: Secondary | ICD-10-CM | POA: Diagnosis not present

## 2021-11-08 MED ORDER — METOPROLOL TARTRATE 25 MG PO TABS: 12.5000 mg | ORAL_TABLET | Freq: Two times a day (BID) | ORAL | 3 refills | Status: DC

## 2021-11-08 NOTE — Patient Instructions (Signed)
Medication Instructions:  1.Start Aspirin 81 mg daily 2.Start metoprolol tartrate 12.5 mg twice daily, this will be 1/2 of a 25 mg tablet twice daily *If you need a refill on your cardiac medications before your next appointment, please call your pharmacy*   Lab Work: None If you have labs (blood work) drawn today and your tests are completely normal, you will receive your results only by: [place] (if you have MyChart) OR A paper copy in the mail If you have any lab test that is abnormal or we need to change your treatment, we will call you to review the results.   Testing/Procedures: Your physician has requested that you have an echocardiogram. Echocardiography is a painless test that uses sound waves to create images of your heart. It provides your doctor with information about the size and shape of your heart and how well your heart's chambers and valves are working. This procedure takes approximately one hour. There are no restrictions for this procedure. Please do NOT wear cologne, perfume, aftershave, or lotions (deodorant is allowed). Please arrive 15 minutes prior to your appointment time.    Follow-Up: At [facility], you and your health needs are our priority.  As part of our continuing mission to provide you with exceptional heart care, we have created designated Provider Care Teams.  These Care Teams include your primary Cardiologist (physician) and Advanced Practice Providers (APPs -  Physician Assistants and Nurse Practitioners) who all work together to provide you with the care you need, when you need it.   Your next appointment:   3 month(s)  The format for your next appointment:   In Person  Provider:   Larae Grooms, MD    Important Information About Sugar      1.

## 2021-11-24 ENCOUNTER — Ambulatory Visit (HOSPITAL_COMMUNITY): Payer: BC Managed Care – PPO | Attending: Physician Assistant

## 2021-11-24 DIAGNOSIS — R072 Precordial pain: Secondary | ICD-10-CM | POA: Diagnosis not present

## 2021-11-24 LAB — ECHOCARDIOGRAM COMPLETE
Area-P 1/2: 5.13 cm2
S' Lateral: 2.9 cm

## 2021-11-24 MED ORDER — PERFLUTREN LIPID MICROSPHERE
1.0000 mL | INTRAVENOUS | Status: AC | PRN
  Administered 2021-11-24: 1 mL via INTRAVENOUS

## 2021-11-30 ENCOUNTER — Telehealth: Payer: Self-pay

## 2021-11-30 NOTE — Telephone Encounter (Signed)
---  Caller says his blood pressure 106/65 this is the last reading he has. Caller has a mild headache. EKG a week ago.  11/30/2021 3:23:25 PM See PCP within 24 Hours [place], RN, Levada Dy  Comments User: Saverio Danker, RN Date/Time Eilene Ghazi Time): 11/30/2021 3:29:44 PM Caller denies any dizziness or weakness. Started new meds for Arhythmia. Has been getting reading at 290 systolic about everyday. Netta Neat at the office, she made an appt with provider for tomorrow at 4pm, only appt available. Caller advised to call back if she has any worsening sx.  Referrals REFERRED TO PCP OFFICE

## 2021-12-01 ENCOUNTER — Ambulatory Visit: Payer: BC Managed Care – PPO | Admitting: Family Medicine

## 2021-12-01 ENCOUNTER — Encounter: Payer: Self-pay | Admitting: Family Medicine

## 2021-12-01 VITALS — BP 108/72 | HR 94 | Temp 98.7°F | Wt 270.0 lb

## 2021-12-01 DIAGNOSIS — R072 Precordial pain: Secondary | ICD-10-CM | POA: Diagnosis not present

## 2021-12-01 DIAGNOSIS — I10 Essential (primary) hypertension: Secondary | ICD-10-CM | POA: Diagnosis not present

## 2021-12-01 NOTE — Progress Notes (Signed)
   Subjective:    Patient ID: Curtis Duran, male    DOB: 1955/05/16, 66 y.o.   MRN: [id number]  HPI Here to follow up some recent cardiologic procedures and to check his low BP. On 10-14-21 he presented to the ED with precordial chest pain, and he wound up getting a left heart catheterization. This showed only minimal non-obstructive plaque in the coronary arteries. He then had an ECHO on 11-24-21 showing an EF of 60-65% and Grade I diastolic dysfunction. He was maintained on his usual Amlodipine and Losartan HCT, and he was started on Metoprolol tartrate. Since then his BP has been running low at home, and he often gets lightheaded when he exerts himself. No more chest pain or SOB.    Review of Systems  Constitutional: Negative.   Respiratory: Negative.    Cardiovascular: Negative.        Objective:   Physical Exam Constitutional:      Appearance: Normal appearance.  Cardiovascular:     Rate and Rhythm: Normal rate and regular rhythm.     Pulses: Normal pulses.     Heart sounds: Normal heart sounds.  Pulmonary:     Effort: Pulmonary effort is normal.     Breath sounds: Normal breath sounds.  Neurological:     Mental Status: He is alert.           Assessment & Plan:  It appears that the addition of the Metoprolol has dropped his BP too low. We decided to stop the Amlodipine and to continue the Metoprolol and the Losartan HCT. He wil monitor the BP at home and report back to [place] in 3-4 weeks. We spent a total of (34   ) minutes reviewing records and discussing these issues.  Alysia Penna, MD

## 2021-12-16 DIAGNOSIS — H401122 Primary open-angle glaucoma, left eye, moderate stage: Secondary | ICD-10-CM | POA: Diagnosis not present

## 2022-01-20 ENCOUNTER — Other Ambulatory Visit: Payer: Self-pay | Admitting: Family Medicine

## 2022-01-22 ENCOUNTER — Other Ambulatory Visit: Payer: Self-pay | Admitting: Family Medicine

## 2022-02-16 NOTE — Progress Notes (Unsigned)
Cardiology Office Note   Date:  02/17/2022   ID:  Curtis Duran, DOB [birth year], MRN [id number]  PCP:  Laurey Morale, MD    No chief complaint on file.    Wt Readings from Last 3 Encounters:  02/17/22 278 lb (126.1 kg)  12/01/21 270 lb (122.5 kg)  11/08/21 274 lb 6.4 oz (124.5 kg)       History of Present Illness: Curtis Duran is a 67 y.o. male  with RF for CAD including hyperlipidemia, DM, HTN, hyperlipidemia  8/23 cath showed: "The left ventricular systolic function is normal.   LV end diastolic pressure is mildly elevated.   The left ventricular ejection fraction is 55-65% by visual estimate.   There is no aortic valve stenosis.   Severe tortuosity of the right subclavian.  Would use right femoral approach if cath was needed in the future.   Minimal, nonobstructive coronary plaque noted.  No etiology of his symptoms identified.  Continue with aggressive preventive therapy and risk factor modification.  Gradually increasing exercise should also be helpful.   Severe right subclavian tortuosity which made torquing catheters difficult.  If cardiac cath was needed in the future, would not use right femoral approach."  Denies : Chest pain. Dizziness. Leg edema. Nitroglycerin use. Orthopnea. Palpitations. Paroxysmal nocturnal dyspnea. Shortness of breath. Syncope.    No regular exercise.  Limited by knee pain.   Past Medical History:  Diagnosis Date   Anxiety    Bronchial pneumonia    back in January   Carpal tunnel syndrome, bilateral    Depression    Diabetes mellitus without complication ([place])    Eczema    FRONT OF CHEST, MIDDLE OF BACK, FRONT OF LEGS   GERD (gastroesophageal reflux disease)    Glaucoma    sees Dr. Camille Bal at [facility].   Heart murmur    as child    Hyperlipidemia    Lightheadedness    Low back pain    Sleep apnea    wears cpap     Past Surgical History:  Procedure Laterality Date   CERVICAL SPINE SURGERY  2014   per Dr.  Ashok Pall   COLONOSCOPY  03/19/2018   per Dr. Loletha Carrow, multiple adenomatous polyps,repeat in 3 yrs    LEFT HEART CATH AND CORONARY ANGIOGRAPHY N/A 10/14/2021   Procedure: LEFT HEART CATH AND CORONARY ANGIOGRAPHY;  Surgeon: Jettie Booze, MD;  Location: [place] [facility];  Service: Cardiovascular;  Laterality: N/A;   LIPOMA EXCISION N/A 12/01/2015   Procedure: EXCISION OF LIPOMA OF BACK;  Surgeon: Erroll Luna, MD;  Location: [place];  Service: General;  Laterality: N/A;   POLYPECTOMY       Current Outpatient Medications  Medication Sig Dispense Refill   aspirin EC 81 MG tablet Take 81 mg by mouth daily. Swallow whole.     atorvastatin (LIPITOR) 10 MG tablet Take 1 tablet (10 mg total) by mouth daily. 90 tablet 3   buPROPion (WELLBUTRIN XL) 300 MG 24 hr tablet TAKE 1 TABLET BY MOUTH EVERY DAY 90 tablet 0   celecoxib (CELEBREX) 200 MG capsule TAKE 1 CAPSULE BY MOUTH TWICE A DAY 180 capsule 3   cholecalciferol (VITAMIN D3) 25 MCG (1000 UNIT) tablet Take 1,000 Units by mouth daily.     cyclobenzaprine (FLEXERIL) 10 MG tablet Take 1 tablet (10 mg total) by mouth 3 (three) times daily as needed for muscle spasms. 60 tablet 1   dorzolamide-timolol (COSOPT) 22.3-6.8 MG/ML  ophthalmic solution Place 1 drop into both eyes daily. 10 mL 0   DULoxetine (CYMBALTA) 30 MG capsule TAKE 1 CAPSULE BY MOUTH EVERY DAY 90 capsule 1   latanoprost (XALATAN) 0.005 % ophthalmic solution Place 1 drop into both eyes at bedtime.     losartan-hydrochlorothiazide (HYZAAR) 50-12.5 MG tablet Take 1 tablet by mouth daily. 90 tablet 3   metFORMIN (GLUCOPHAGE) 500 MG tablet Take 1 tablet (500 mg total) by mouth 2 (two) times daily with a meal. 180 tablet 3   metoprolol tartrate (LOPRESSOR) 25 MG tablet Take 0.5 tablets (12.5 mg total) by mouth 2 (two) times daily. 90 tablet 3   nitroGLYCERIN (NITROSTAT) 0.4 MG SL tablet Place 1 tablet (0.4 mg total) under the tongue every 5 (five) minutes as needed for chest pain. 25  tablet 6   omeprazole (PRILOSEC) 40 MG capsule TAKE 1 CAPSULE BY MOUTH TWICE A DAY (Patient taking differently: 40 mg daily.) 180 capsule 0   RHOPRESSA 0.02 % SOLN Place 1 drop into both eyes every morning.     hydrOXYzine (ATARAX) 25 MG tablet Take 25 mg by mouth at bedtime. (Patient not taking: Reported on 02/17/2022)     No current facility-administered medications for this visit.    Allergies:   Citalopram hydrobromide, Crestor [rosuvastatin], and Other    Social History:  The patient  reports that he has never smoked. He has never used smokeless tobacco. He reports that he does not drink alcohol and does not use drugs.   Family History:  The patient's family history includes Cancer in his father; Diabetes in an other family member.    ROS:  Please see the history of present illness.   Otherwise, review of systems are positive for knee pain.   All other systems are reviewed and negative.    PHYSICAL EXAM: VS:  BP 130/78   Pulse 96   Ht '6\' 4"'$  (1.93 m)   Wt 278 lb (126.1 kg)   SpO2 97%   BMI 33.84 kg/m  , BMI Body mass index is 33.84 kg/m. GEN: Well nourished, well developed, in no acute distress HEENT: normal Neck: no JVD, carotid bruits, or masses Cardiac: RRR; no murmurs, rubs, or gallops,no edema  Respiratory:  clear to auscultation bilaterally, normal work of breathing GI: soft, nontender, nondistended, + BS MS: no deformity or atrophy; difficult to palpate right radial pulse; during Allen's test, it appears the bulk of flow to the hand is from the ulnar artery Skin: warm and dry, no rash Neuro:  Strength and sensation are intact Psych: euthymic mood, full affect    Recent Labs: 03/09/2021: ALT 35; TSH 3.67 10/11/2021: BUN 17; Creatinine, Ser 1.31; Hemoglobin 15.5; Platelets 310; Potassium 4.6; Sodium 139   Lipid Panel    Component Value Date/Time   CHOL 159 03/09/2021 0847   TRIG 124.0 03/09/2021 0847   HDL 61.80 03/09/2021 0847   CHOLHDL 3 03/09/2021 0847    VLDL 24.8 03/09/2021 0847   LDLCALC 73 03/09/2021 0847   LDLDIRECT 169.6 03/07/2011 1050     Other studies Reviewed: Additional studies/ records that were reviewed today with results demonstrating: cath results reviewed.   ASSESSMENT AND PLAN:  Chest pain: Resolved.  Cath in 2023 showed minimal coronary plaquing.  Healthy lifestyle, Regular exercise, Healthy diet.  Normal LVEF.  Would not use right arm for access if cath was needed in the future.  Right radial may be occluded based on Allen's test done. Hyperlipidemia: LDL 73. Continue atorvastatin.  HTN: The current medical regimen is effective;  continue present plan and medications. DM: A1C 6.6.  Increase exercise to 30 minutes a day 5 days a week will help keep the blood sugar down.  On metformin.  Consider switching metformin to the extended release formulation to minimize GI side effects. Family h/o CAD: Brother in [place] had two MIs, another brother who passed away from an MI.  Obesity:Work on weight loss.  Asked him to set a goal of getting down to 250 pounds in 1 year from now.   Current medicines are reviewed at length with the patient today.  The patient concerns regarding his medicines were addressed.  The following changes have been made:  No change  Labs/ tests ordered today include:  No orders of the defined types were placed in this encounter.   Recommend 150 minutes/week of aerobic exercise Low fat, low carb, high fiber diet recommended  Disposition:   FU in 1 year   Signed, Larae Grooms, MD  02/17/2022 11:34 AM    [facility] [address], [place], [place]  [address] Phone: [phone]; Fax: [phone]

## 2022-02-17 ENCOUNTER — Ambulatory Visit: Payer: BC Managed Care – PPO | Attending: Interventional Cardiology | Admitting: Interventional Cardiology

## 2022-02-17 VITALS — BP 130/78 | HR 96 | Ht 76.0 in | Wt 278.0 lb

## 2022-02-17 DIAGNOSIS — Z8249 Family history of ischemic heart disease and other diseases of the circulatory system: Secondary | ICD-10-CM | POA: Diagnosis not present

## 2022-02-17 DIAGNOSIS — E782 Mixed hyperlipidemia: Secondary | ICD-10-CM | POA: Diagnosis not present

## 2022-02-17 DIAGNOSIS — I10 Essential (primary) hypertension: Secondary | ICD-10-CM

## 2022-02-17 DIAGNOSIS — R072 Precordial pain: Secondary | ICD-10-CM

## 2022-02-17 DIAGNOSIS — E119 Type 2 diabetes mellitus without complications: Secondary | ICD-10-CM

## 2022-02-17 NOTE — Patient Instructions (Signed)
Medication Instructions:  Your physician recommends that you continue on your current medications as directed. Please refer to the Current Medication list given to you today.  *If you need a refill on your cardiac medications before your next appointment, please call your pharmacy*   Lab Work: none If you have labs (blood work) drawn today and your tests are completely normal, you will receive your results only by: MyChart Message (if you have MyChart) OR A paper copy in the mail If you have any lab test that is abnormal or we need to change your treatment, we will call you to review the results.   Testing/Procedures: none   Follow-Up: At [facility], you and your health needs are our priority.  As part of our continuing mission to provide you with exceptional heart care, we have created designated Provider Care Teams.  These Care Teams include your primary Cardiologist (physician) and Advanced Practice Providers (APPs -  Physician Assistants and Nurse Practitioners) who all work together to provide you with the care you need, when you need it.  We recommend signing up for the patient portal called "MyChart".  Sign up information is provided on this After Visit Summary.  MyChart is used to connect with patients for Virtual Visits (Telemedicine).  Patients are able to view lab/test results, encounter notes, upcoming appointments, etc.  Non-urgent messages can be sent to your provider as well.   To learn more about what you can do with MyChart, go to [email or web address].    Your next appointment:   12 month(s)  Provider:   Jayadeep Varanasi, MD     Other Instructions  High-Fiber Eating Plan Fiber, also called dietary fiber, is a type of carbohydrate. It is found foods such as fruits, vegetables, whole grains, and beans. A high-fiber diet can have many health benefits. Your health care provider may recommend a high-fiber diet to help: Prevent constipation. Fiber can make  your bowel movements more regular. Lower your cholesterol. Relieve the following conditions: Inflammation of veins in the anus (hemorrhoids). Inflammation of specific areas of the digestive tract (uncomplicated diverticulosis). A problem of the large intestine, also called the colon, that sometimes causes pain and diarrhea (irritable bowel syndrome, or IBS). Prevent overeating as part of a weight-loss plan. Prevent heart disease, type 2 diabetes, and certain cancers. What are tips for following this plan? Reading food labels  Check the nutrition facts label on food products for the amount of dietary fiber. Choose foods that have 5 grams of fiber or more per serving. The goals for recommended daily fiber intake include: Men (age 50 or younger): 34-38 g. Men (over age 50): 28-34 g. Women (age 50 or younger): 25-28 g. Women (over age 50): 22-25 g. Your daily fiber goal is _____________ g. Shopping Choose whole fruits and vegetables instead of processed forms, such as apple juice or applesauce. Choose a wide variety of high-fiber foods such as avocados, lentils, oats, and kidney beans. Read the nutrition facts label of the foods you choose. Be aware of foods with added fiber. These foods often have high sugar and sodium amounts per serving. Cooking Use whole-grain flour for baking and cooking. Cook with brown rice instead of white rice. Meal planning Start the day with a breakfast that is high in fiber, such as a cereal that contains 5 g of fiber or more per serving. Eat breads and cereals that are made with whole-grain flour instead of refined flour or white flour. Eat brown rice, bulgur wheat,   or millet instead of white rice. Use beans in place of meat in soups, salads, and pasta dishes. Be sure that half of the grains you eat each day are whole grains. General information You can get the recommended daily intake of dietary fiber by: Eating a variety of fruits, vegetables, grains,  nuts, and beans. Taking a fiber supplement if you are not able to take in enough fiber in your diet. It is better to get fiber through food than from a supplement. Gradually increase how much fiber you consume. If you increase your intake of dietary fiber too quickly, you may have bloating, cramping, or gas. Drink plenty of water to help you digest fiber. Choose high-fiber snacks, such as berries, raw vegetables, nuts, and popcorn. What foods should I eat? Fruits Berries. Pears. Apples. Oranges. Avocado. Prunes and raisins. Dried figs. Vegetables Sweet potatoes. Spinach. Kale. Artichokes. Cabbage. Broccoli. Cauliflower. Green peas. Carrots. Squash. Grains Whole-grain breads. Multigrain cereal. Oats and oatmeal. Brown rice. Barley. Bulgur wheat. Millet. Quinoa. Bran muffins. Popcorn. Rye wafer crackers. Meats and other proteins Navy beans, kidney beans, and pinto beans. Soybeans. Split peas. Lentils. Nuts and seeds. Dairy Fiber-fortified yogurt. Beverages Fiber-fortified soy milk. Fiber-fortified orange juice. Other foods Fiber bars. The items listed above may not be a complete list of recommended foods and beverages. Contact a dietitian for more information. What foods should I avoid? Fruits Fruit juice. Cooked, strained fruit. Vegetables Fried potatoes. Canned vegetables. Well-cooked vegetables. Grains White bread. Pasta made with refined flour. White rice. Meats and other proteins Fatty cuts of meat. Fried chicken or fried fish. Dairy Milk. Yogurt. Cream cheese. Sour cream. Fats and oils Butters. Beverages Soft drinks. Other foods Cakes and pastries. The items listed above may not be a complete list of foods and beverages to avoid. Talk with your dietitian about what choices are best for you. Summary Fiber is a type of carbohydrate. It is found in foods such as fruits, vegetables, whole grains, and beans. A high-fiber diet has many benefits. It can help to prevent  constipation, lower blood cholesterol, aid weight loss, and reduce your risk of heart disease, diabetes, and certain cancers. Increase your intake of fiber gradually. Increasing fiber too quickly may cause cramping, bloating, and gas. Drink plenty of water while you increase the amount of fiber you consume. The best sources of fiber include whole fruits and vegetables, whole grains, nuts, seeds, and beans. This information is not intended to replace advice given to you by your health care provider. Make sure you discuss any questions you have with your health care provider. Document Revised: 05/09/2019 Document Reviewed: 05/09/2019 Elsevier Patient Education  2023 [employer]vier Inc.   

## 2022-02-20 ENCOUNTER — Other Ambulatory Visit: Payer: Self-pay | Admitting: Family Medicine

## 2022-03-02 ENCOUNTER — Encounter: Payer: Self-pay | Admitting: Family Medicine

## 2022-03-02 ENCOUNTER — Ambulatory Visit (INDEPENDENT_AMBULATORY_CARE_PROVIDER_SITE_OTHER): Payer: BC Managed Care – PPO | Admitting: Family Medicine

## 2022-03-02 VITALS — BP 116/76 | HR 90 | Temp 98.9°F | Ht 76.0 in | Wt 277.0 lb

## 2022-03-02 DIAGNOSIS — Z125 Encounter for screening for malignant neoplasm of prostate: Secondary | ICD-10-CM | POA: Diagnosis not present

## 2022-03-02 DIAGNOSIS — Z0000 Encounter for general adult medical examination without abnormal findings: Secondary | ICD-10-CM | POA: Diagnosis not present

## 2022-03-02 DIAGNOSIS — Z23 Encounter for immunization: Secondary | ICD-10-CM

## 2022-03-02 DIAGNOSIS — E669 Obesity, unspecified: Secondary | ICD-10-CM | POA: Diagnosis not present

## 2022-03-02 LAB — BASIC METABOLIC PANEL
BUN: 18 mg/dL (ref 6–23)
CO2: 25 mEq/L (ref 19–32)
Calcium: 10 mg/dL (ref 8.4–10.5)
Chloride: 103 mEq/L (ref 96–112)
Creatinine, Ser: 1.19 mg/dL (ref 0.40–1.50)
GFR: 62.94 mL/min (ref >60.00)
Glucose, Bld: 133 mg/dL — ABNORMAL HIGH (ref 70–99)
Potassium: 4 mEq/L (ref 3.5–5.1)
Sodium: 139 mEq/L (ref 135–145)

## 2022-03-02 LAB — HEPATIC FUNCTION PANEL
ALT: 38 U/L (ref 0–53)
AST: 23 U/L (ref 0–37)
Albumin: 4.5 g/dL (ref 3.5–5.2)
Alkaline Phosphatase: 53 U/L (ref 39–117)
Bilirubin, Direct: 0.1 mg/dL (ref 0.0–0.3)
Total Bilirubin: 0.4 mg/dL (ref 0.2–1.2)
Total Protein: 7 g/dL (ref 6.0–8.3)

## 2022-03-02 LAB — CBC WITH DIFFERENTIAL/PLATELET
Basophils Absolute: 0.1 10*3/uL (ref 0.0–0.1)
Basophils Relative: 0.7 % (ref 0.0–3.0)
Eosinophils Absolute: 0.3 10*3/uL (ref 0.0–0.7)
Eosinophils Relative: 3 % (ref 0.0–5.0)
HCT: 45.5 % (ref 39.0–52.0)
Hemoglobin: 15.2 g/dL (ref 13.0–17.0)
Lymphocytes Relative: 29.8 % (ref 12.0–46.0)
Lymphs Abs: 2.6 10*3/uL (ref 0.7–4.0)
MCHC: 33.4 g/dL (ref 30.0–36.0)
MCV: 90.5 fl (ref 78.0–100.0)
Monocytes Absolute: 0.9 10*3/uL (ref 0.1–1.0)
Monocytes Relative: 10.3 % (ref 3.0–12.0)
Neutro Abs: 4.8 10*3/uL (ref 1.4–7.7)
Neutrophils Relative %: 56.2 % (ref 43.0–77.0)
Platelets: 329 10*3/uL (ref 150.0–400.0)
RBC: 5.03 Mil/uL (ref 4.22–5.81)
RDW: 14.6 % (ref 11.5–15.5)
WBC: 8.6 10*3/uL (ref 4.0–10.5)

## 2022-03-02 LAB — LIPID PANEL
Cholesterol: 168 mg/dL (ref 0–200)
HDL: 58.7 mg/dL (ref >39.00)
LDL Cholesterol: 88 mg/dL (ref 0–99)
NonHDL: 108.86
Total CHOL/HDL Ratio: 3
Triglycerides: 102 mg/dL (ref 0.0–149.0)
VLDL: 20.4 mg/dL (ref 0.0–40.0)

## 2022-03-02 LAB — HEMOGLOBIN A1C: Hgb A1c MFr Bld: 6.6 % — ABNORMAL HIGH (ref 4.6–6.5)

## 2022-03-02 LAB — PSA: PSA: 3.74 ng/mL (ref 0.10–4.00)

## 2022-03-02 LAB — TSH: TSH: 1.8 u[IU]/mL (ref 0.35–5.50)

## 2022-03-02 MED ORDER — DULOXETINE HCL 30 MG PO CPEP: 30.0000 mg | ORAL_CAPSULE | Freq: Every day | ORAL | 3 refills | Status: DC

## 2022-03-02 NOTE — Progress Notes (Signed)
   Subjective:    Patient ID: Curtis Duran, male    DOB: 09/17/1955, 67 y.o.   MRN: [id number]  HPI Here for a well exam. He feels well. He is trying to lose some weight. He had an eye exam in December, and he says this was normal.    Review of Systems  Constitutional: Negative.   HENT: Negative.    Eyes: Negative.   Respiratory: Negative.    Cardiovascular: Negative.   Gastrointestinal: Negative.   Genitourinary: Negative.   Musculoskeletal: Negative.   Skin: Negative.   Neurological: Negative.   Psychiatric/Behavioral: Negative.         Objective:   Physical Exam Constitutional:      General: He is not in acute distress.    Appearance: He is well-developed. He is obese. He is not diaphoretic.  HENT:     Head: Normocephalic and atraumatic.     Right Ear: External ear normal.     Left Ear: External ear normal.     Nose: Nose normal.     Mouth/Throat:     Pharynx: No oropharyngeal exudate.  Eyes:     General: No scleral icterus.       Right eye: No discharge.        Left eye: No discharge.     Conjunctiva/sclera: Conjunctivae normal.     Pupils: Pupils are equal, round, and reactive to light.  Neck:     Thyroid: No thyromegaly.     Vascular: No JVD.     Trachea: No tracheal deviation.  Cardiovascular:     Rate and Rhythm: Normal rate and regular rhythm.     Heart sounds: Normal heart sounds. No murmur heard.    No friction rub. No gallop.  Pulmonary:     Effort: Pulmonary effort is normal. No respiratory distress.     Breath sounds: Normal breath sounds. No wheezing or rales.  Chest:     Chest wall: No tenderness.  Abdominal:     General: Bowel sounds are normal. There is no distension.     Palpations: Abdomen is soft. There is no mass.     Tenderness: There is no abdominal tenderness. There is no guarding or rebound.  Genitourinary:    Penis: Normal. No tenderness.      Testes: Normal.     Prostate: Normal.     Rectum: Normal. Guaiac result negative.   Musculoskeletal:        General: No tenderness. Normal range of motion.     Cervical back: Neck supple.  Lymphadenopathy:     Cervical: No cervical adenopathy.  Skin:    General: Skin is warm and dry.     Coloration: Skin is not pale.     Findings: No erythema or rash.  Neurological:     Mental Status: He is alert and oriented to person, place, and time.     Cranial Nerves: No cranial nerve deficit.     Motor: No abnormal muscle tone.     Coordination: Coordination normal.     Deep Tendon Reflexes: Reflexes are normal and symmetric. Reflexes normal.  Psychiatric:        Behavior: Behavior normal.        Thought Content: Thought content normal.        Judgment: Judgment normal.           Assessment & Plan:  Well exam. We discussed diet and exercise. Get fasting labs. Alysia Penna, MD

## 2022-04-01 DIAGNOSIS — H04123 Dry eye syndrome of bilateral lacrimal glands: Secondary | ICD-10-CM | POA: Diagnosis not present

## 2022-04-01 DIAGNOSIS — H401131 Primary open-angle glaucoma, bilateral, mild stage: Secondary | ICD-10-CM | POA: Diagnosis not present

## 2022-04-18 ENCOUNTER — Other Ambulatory Visit: Payer: Self-pay | Admitting: Family Medicine

## 2022-04-24 ENCOUNTER — Other Ambulatory Visit: Payer: Self-pay | Admitting: Family Medicine

## 2022-04-24 DIAGNOSIS — I10 Essential (primary) hypertension: Secondary | ICD-10-CM

## 2022-05-20 ENCOUNTER — Other Ambulatory Visit: Payer: Self-pay | Admitting: Family Medicine

## 2022-07-15 ENCOUNTER — Other Ambulatory Visit: Payer: Self-pay | Admitting: Family Medicine

## 2022-07-15 DIAGNOSIS — I10 Essential (primary) hypertension: Secondary | ICD-10-CM

## 2022-07-16 ENCOUNTER — Other Ambulatory Visit: Payer: Self-pay | Admitting: Family Medicine

## 2022-07-18 ENCOUNTER — Telehealth: Payer: Self-pay | Admitting: Family Medicine

## 2022-07-18 NOTE — Telephone Encounter (Signed)
Pt is calling and does not know if he should be taking amlodipine 5 mg. Please advise

## 2022-07-20 NOTE — Telephone Encounter (Signed)
Spoke with pt confused of the BP medications he should be taking, advised per Dr Clent Ridges noted. Advised pt to schedule appointment for a BP/ Meds review. App scheduled for 07/22/22

## 2022-07-22 ENCOUNTER — Encounter: Payer: Self-pay | Admitting: Family Medicine

## 2022-07-22 ENCOUNTER — Ambulatory Visit: Payer: BC Managed Care – PPO | Admitting: Family Medicine

## 2022-07-22 VITALS — BP 110/70 | HR 88 | Temp 98.5°F | Wt 263.0 lb

## 2022-07-22 DIAGNOSIS — E1140 Type 2 diabetes mellitus with diabetic neuropathy, unspecified: Secondary | ICD-10-CM | POA: Diagnosis not present

## 2022-07-22 DIAGNOSIS — I10 Essential (primary) hypertension: Secondary | ICD-10-CM | POA: Diagnosis not present

## 2022-07-22 DIAGNOSIS — G629 Polyneuropathy, unspecified: Secondary | ICD-10-CM | POA: Insufficient documentation

## 2022-07-22 DIAGNOSIS — F321 Major depressive disorder, single episode, moderate: Secondary | ICD-10-CM

## 2022-07-22 DIAGNOSIS — G4733 Obstructive sleep apnea (adult) (pediatric): Secondary | ICD-10-CM

## 2022-07-22 DIAGNOSIS — Z7984 Long term (current) use of oral hypoglycemic drugs: Secondary | ICD-10-CM

## 2022-07-22 MED ORDER — ATORVASTATIN CALCIUM 10 MG PO TABS: 10.0000 mg | ORAL_TABLET | Freq: Every day | ORAL | 3 refills | Status: DC

## 2022-07-22 MED ORDER — METOPROLOL TARTRATE 25 MG PO TABS: 12.5000 mg | ORAL_TABLET | Freq: Two times a day (BID) | ORAL | 3 refills | Status: DC

## 2022-07-22 MED ORDER — LOSARTAN POTASSIUM-HCTZ 50-12.5 MG PO TABS: 1.0000 | ORAL_TABLET | Freq: Every day | ORAL | 3 refills | Status: DC

## 2022-07-22 MED ORDER — GABAPENTIN 100 MG PO CAPS: 100.0000 mg | ORAL_CAPSULE | Freq: Two times a day (BID) | ORAL | 3 refills | Status: DC

## 2022-07-22 NOTE — Progress Notes (Signed)
   Subjective:    Patient ID: Curtis Duran, male    DOB: July 28, 1955, 67 y.o.   MRN: [id number]  HPI Here to follow up on issues. He feels well in general, but he does mention some numbness and tingling in both feet that started a few months ago. There is no pain. His BP has been table. He had been taking Amlodipine on top of his Losartan HCT and Metoprolol tartrate, but since he was having spells of lightheadedness and his systolic BP was dropping to the 100-110 range, we stopped it. He has had no more spells like these. His am fasting glucoses average 120-130. His moods have been stable. He also complains about feeling tired all the time. He was diagnosed with sleep apnea, and he was prescribed a CPAP machine by Dr. Cyril Mourning. Curtis Duran says he has not used this in years because it was uncomfortable, and he has not seen Dr. Vassie Loll since 2019.    Review of Systems  Constitutional: Negative.   Respiratory: Negative.    Cardiovascular: Negative.   Neurological:  Positive for numbness. Negative for weakness.  Psychiatric/Behavioral:  Positive for sleep disturbance. Negative for agitation, behavioral problems, confusion, decreased concentration, dysphoric mood and hallucinations. The patient is not nervous/anxious.        Objective:   Physical Exam Constitutional:      Appearance: Normal appearance.  Cardiovascular:     Rate and Rhythm: Normal rate and regular rhythm.     Pulses: Normal pulses.     Heart sounds: Normal heart sounds.  Pulmonary:     Effort: Pulmonary effort is normal.     Breath sounds: Normal breath sounds.  Neurological:     General: No focal deficit present.     Mental Status: He is alert and oriented to person, place, and time.  Psychiatric:        Mood and Affect: Mood normal.        Behavior: Behavior normal.        Thought Content: Thought content normal.           Assessment & Plan:  His diabetes is well controlled. His A1c today is 5.9%. Has begun to have  some neuropathy in his feet however, so he will try Gabapentin 100 mg BID. His HTN is well controlled so we will continue on Losartan HCT and Metoprolol. His depression seems to be stable. His sleep apnea has not been treated for years, so we will refer him back to see Dr. Vassie Loll again. We spent a total of (32   ) minutes reviewing records and discussing these issues.  Gershon Crane, MD

## 2022-08-02 DIAGNOSIS — H401131 Primary open-angle glaucoma, bilateral, mild stage: Secondary | ICD-10-CM | POA: Diagnosis not present

## 2022-09-21 ENCOUNTER — Other Ambulatory Visit: Payer: Self-pay | Admitting: Family Medicine

## 2022-09-21 DIAGNOSIS — I10 Essential (primary) hypertension: Secondary | ICD-10-CM

## 2022-09-27 ENCOUNTER — Telehealth: Payer: Self-pay | Admitting: Interventional Cardiology

## 2022-09-27 NOTE — Telephone Encounter (Signed)
  Per MyChart scheduling message:   Initial Complaint:  lightheadedness and nausea  STAT if patient feels like he/she is going to faint   1. Are you feeling dizzy, lightheaded, or faint right now?     2. Have you passed out?   (If yes move to .[facility])   3. Do you have any other symptoms?    4. Have you checked your HR and BP (record if available)?     No light headist right now.Some time when I get up and start walking.I have to stop for a little time. I checked my blood pressure and it said 106/67.My heart rate is 80 I have no other problems.

## 2022-09-27 NOTE — Telephone Encounter (Signed)
I spoke with patient.  He reports nausea and lightheadedness started about a week and a half ago. Denies any congestion, allergy or cold symptoms. He reports he will feel nauseated in the morning and the evening.  Sometimes is better after eating. No vomiting.  Lightheadedness will occur when he stands up and starts walking.  Patient drinks water in the AM and PM with his medications but unless he is outside does not drink anything else during the day.  I advised patient to try and increase hydration throughout the day and to change positions slowly. I asked patient to follow up with PCP if nausea and lightheadedness continue.

## 2022-10-01 ENCOUNTER — Other Ambulatory Visit: Payer: Self-pay | Admitting: Family Medicine

## 2022-10-01 DIAGNOSIS — E119 Type 2 diabetes mellitus without complications: Secondary | ICD-10-CM

## 2022-10-04 IMAGING — CR DG CHEST 2V
2 series · 2 of 2 positions shown · non-contrast
Comparison: None.

CLINICAL DATA: Shortness of breath.

EXAM:
CHEST - 2 VIEW

[chest pa]
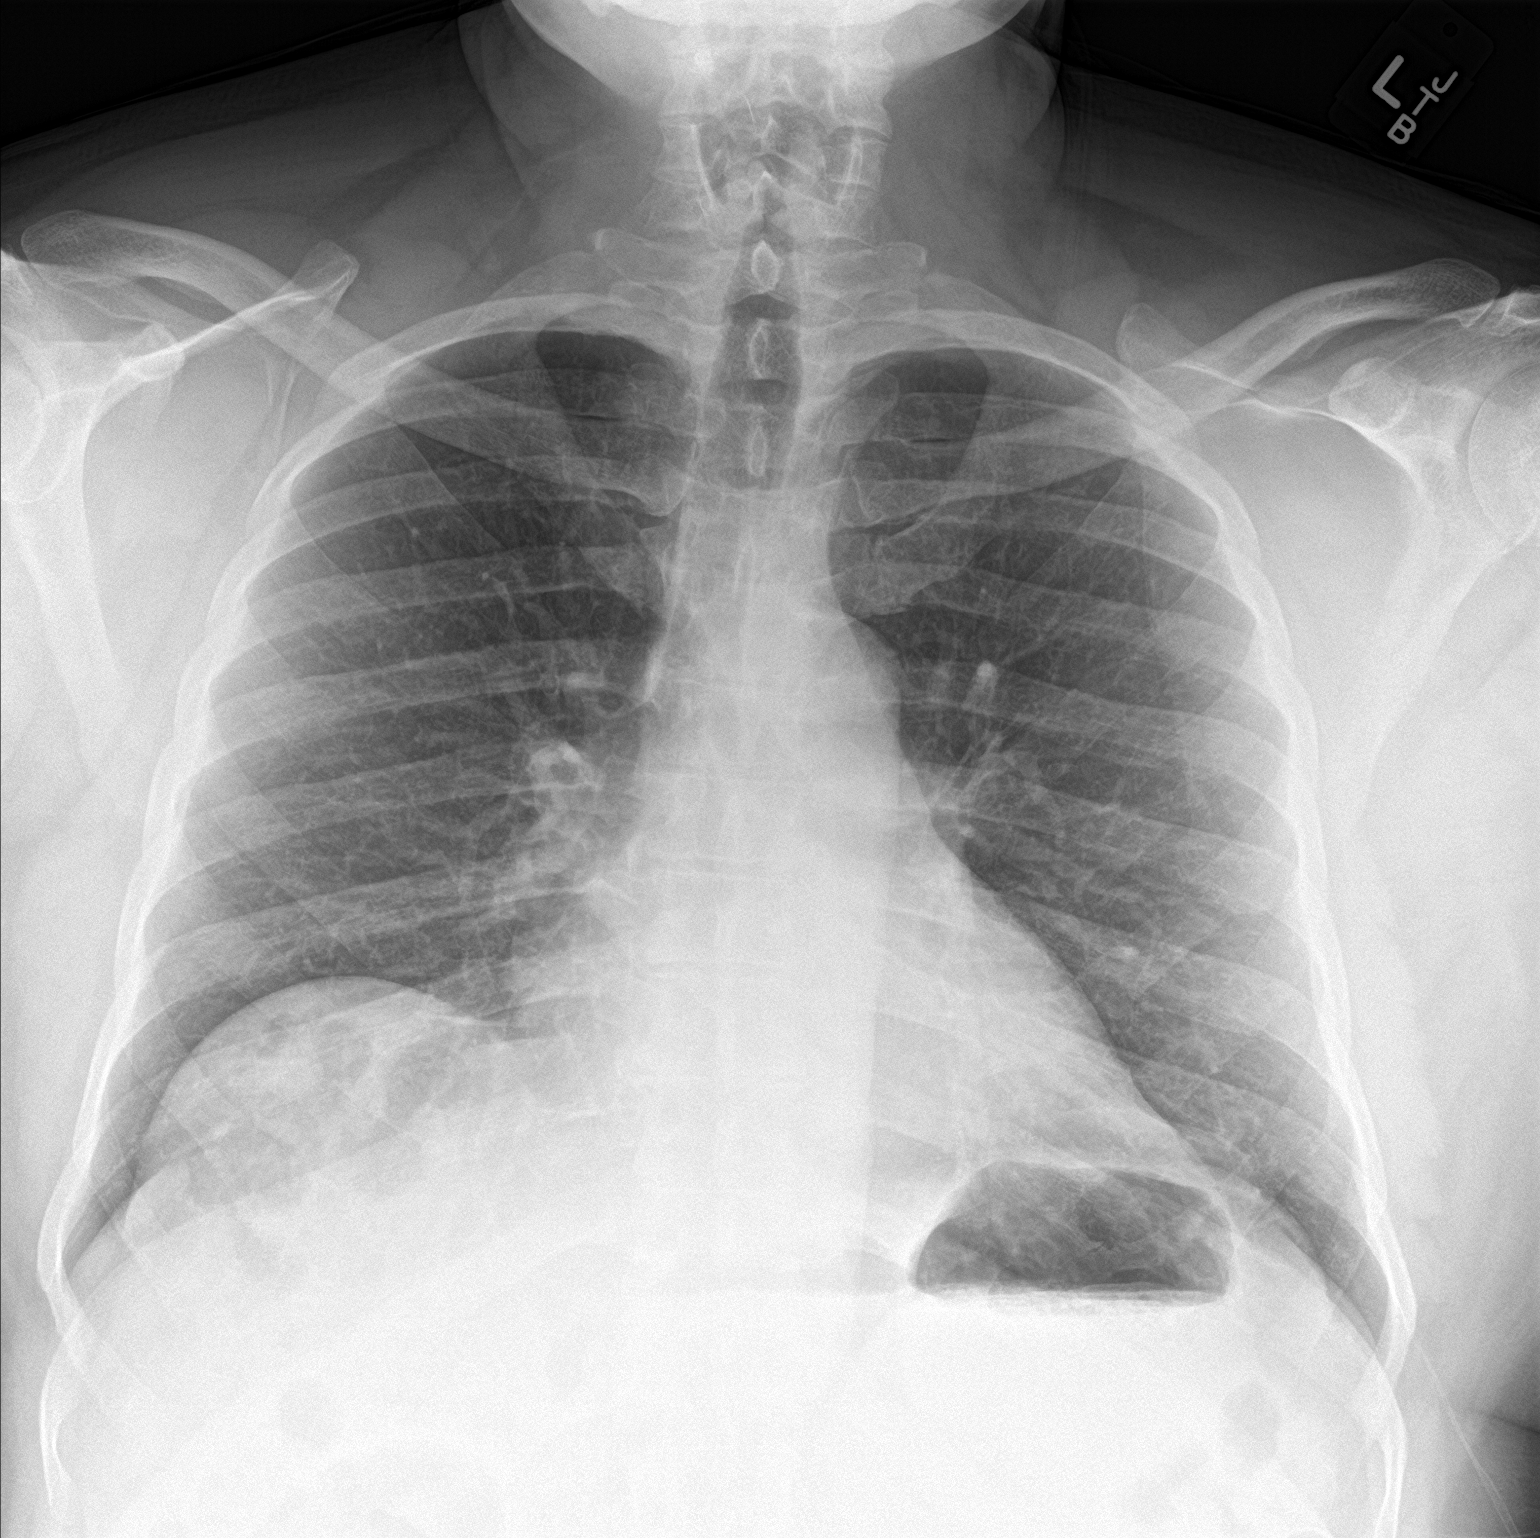

[chest lat]
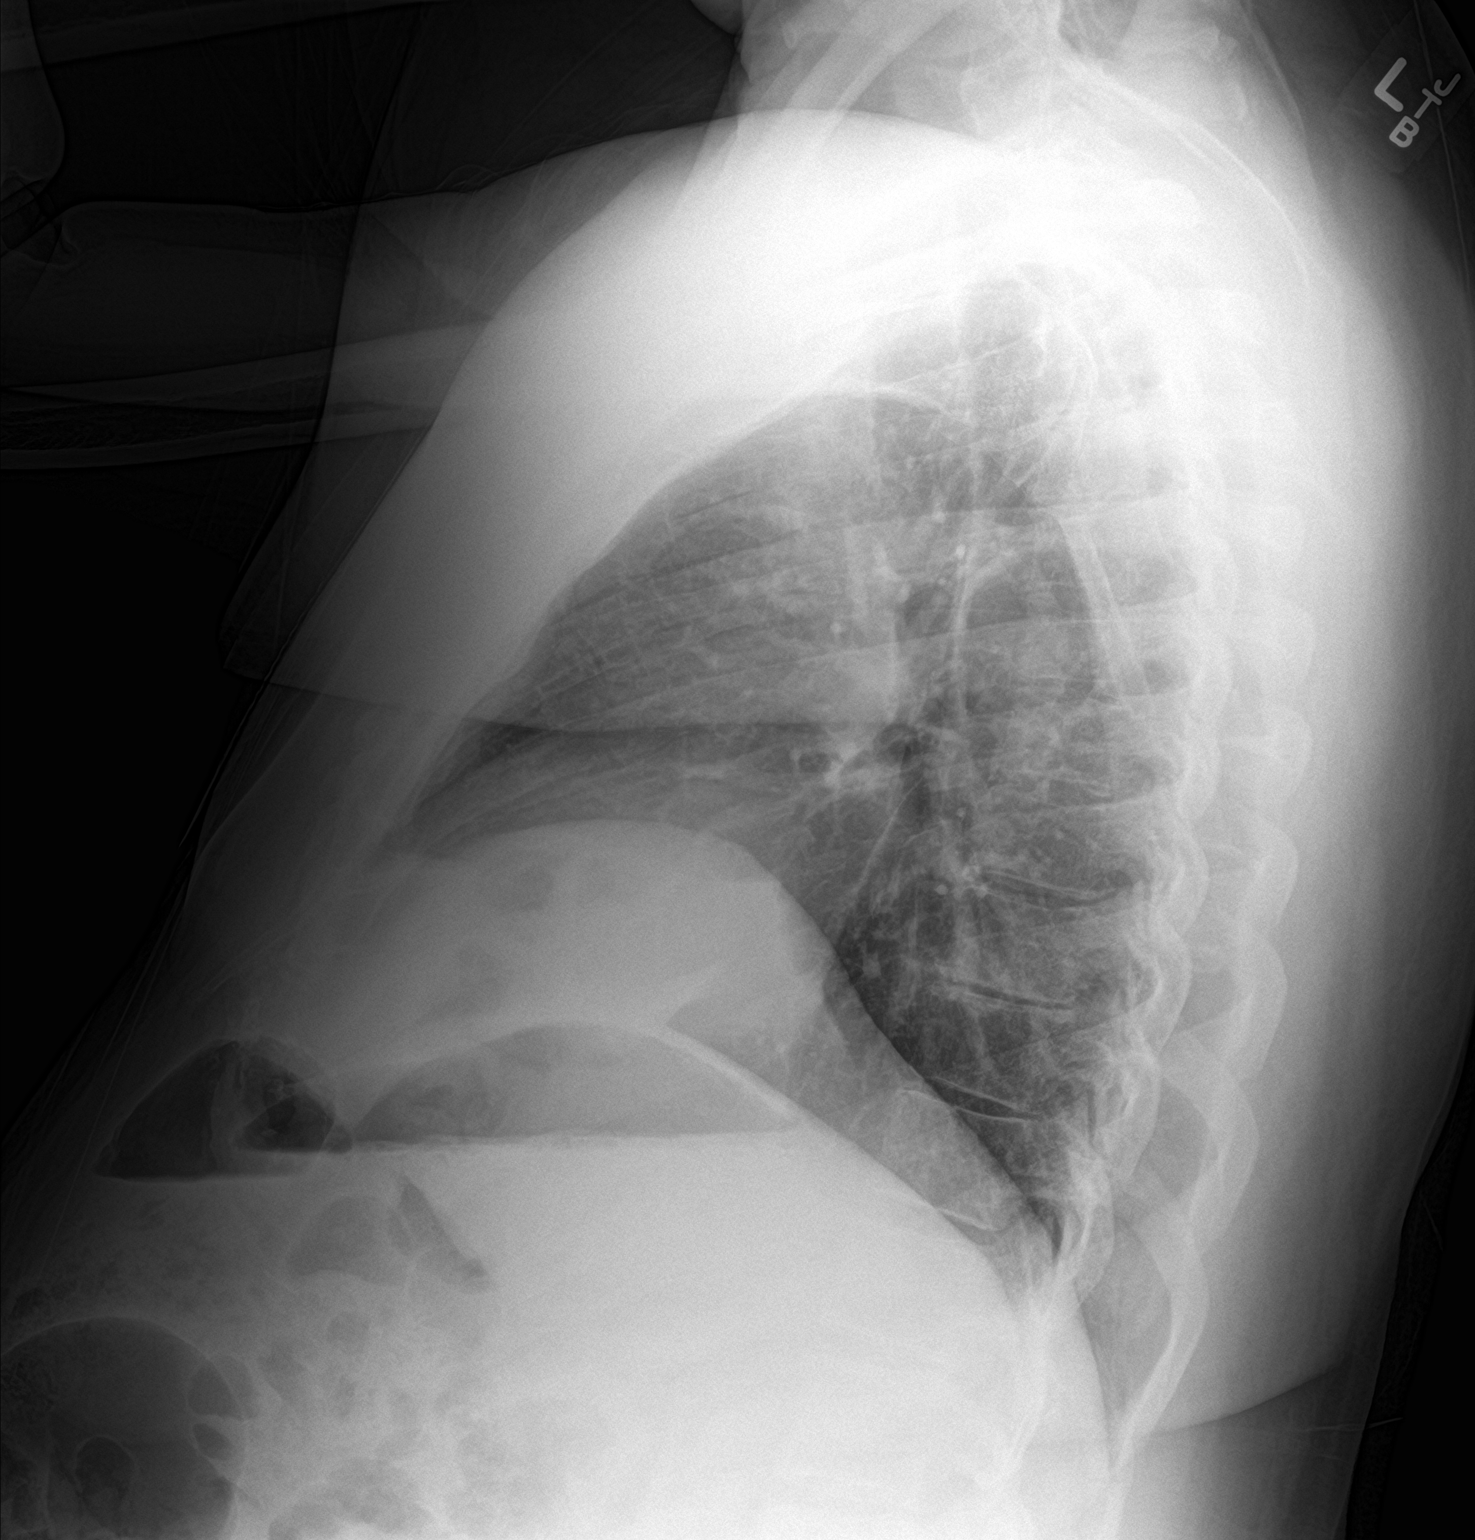

[2 of 2 positions shown; findings below may reference images not displayed]

FINDINGS: The heart size and mediastinal contours are within normal limits.
Both lungs are clear. No pneumothorax or pleural effusion is noted.
The visualized skeletal structures are unremarkable.
IMPRESSION: No active cardiopulmonary disease.

## 2022-10-13 ENCOUNTER — Other Ambulatory Visit: Payer: Self-pay | Admitting: Family Medicine

## 2022-10-16 ENCOUNTER — Other Ambulatory Visit: Payer: Self-pay | Admitting: Family Medicine

## 2022-10-21 ENCOUNTER — Ambulatory Visit (INDEPENDENT_AMBULATORY_CARE_PROVIDER_SITE_OTHER): Payer: BC Managed Care – PPO

## 2022-10-21 DIAGNOSIS — Z23 Encounter for immunization: Secondary | ICD-10-CM

## 2022-11-16 ENCOUNTER — Encounter (HOSPITAL_COMMUNITY): Payer: Self-pay | Admitting: Emergency Medicine

## 2022-11-16 ENCOUNTER — Ambulatory Visit (HOSPITAL_COMMUNITY): Payer: BC Managed Care – PPO

## 2022-11-16 ENCOUNTER — Ambulatory Visit (HOSPITAL_COMMUNITY)
Admission: EM | Admit: 2022-11-16 | Discharge: 2022-11-16 | Disposition: A | Discharge status: Home / Self Care | Payer: BC Managed Care – PPO | Attending: Emergency Medicine | Admitting: Emergency Medicine

## 2022-11-16 DIAGNOSIS — J22 Unspecified acute lower respiratory infection: Secondary | ICD-10-CM

## 2022-11-16 DIAGNOSIS — R9389 Abnormal findings on diagnostic imaging of other specified body structures: Secondary | ICD-10-CM | POA: Diagnosis not present

## 2022-11-16 DIAGNOSIS — Z20822 Contact with and (suspected) exposure to covid-19: Secondary | ICD-10-CM

## 2022-11-16 DIAGNOSIS — R058 Other specified cough: Secondary | ICD-10-CM | POA: Diagnosis not present

## 2022-11-16 LAB — BASIC METABOLIC PANEL
Anion gap: 11 (ref 5–15)
BUN: 14 mg/dL (ref 8–23)
CO2: 23 mmol/L (ref 22–32)
Calcium: 9.2 mg/dL (ref 8.9–10.3)
Chloride: 102 mmol/L (ref 98–111)
Creatinine, Ser: 1.1 mg/dL (ref 0.61–1.24)
GFR, Estimated: >60 mL/min (ref >60)
Glucose, Bld: 116 mg/dL — ABNORMAL HIGH (ref 70–99)
Potassium: 3.5 mmol/L (ref 3.5–5.1)
Sodium: 136 mmol/L (ref 135–145)

## 2022-11-16 MED ORDER — AEROCHAMBER MV MISC: 1 refills | Status: DC

## 2022-11-16 MED ORDER — DOXYCYCLINE HYCLATE 100 MG PO CAPS: 100.0000 mg | ORAL_CAPSULE | Freq: Two times a day (BID) | ORAL | 0 refills | Status: AC

## 2022-11-16 MED ORDER — PROMETHAZINE-DM 6.25-15 MG/5ML PO SYRP: 5.0000 mL | ORAL_SOLUTION | Freq: Four times a day (QID) | ORAL | 0 refills | Status: DC | PRN

## 2022-11-16 MED ORDER — ALBUTEROL SULFATE HFA 108 (90 BASE) MCG/ACT IN AERS: 1.0000 | INHALATION_SPRAY | RESPIRATORY_TRACT | 0 refills | Status: DC | PRN

## 2022-11-16 NOTE — Discharge Instructions (Addendum)
I did not appreciate any pneumonia on your x-ray, however, not all pneumonias show up on x-ray.  We will contact you if the radiology over read is different enough from my reading and we need to change management.  If your COVID is positive, you do qualify for antivirals, if it comes back in time.    2 puffs from your albuterol inhaler using your spacer every 4 hours for 2 days, then every 6 hours for 2 days, then as needed.  Saline nasal irrigation with a NeilMed sinus rinse and distilled water as often as you want, plain Mucinex, Promethazine DM for the cough.  Stop the Coricidin.  I am sending you home with a wait-and-see prescription of doxycycline which will cover pneumonia and a sinus infection if you start to get worse.  If your x-ray is positive for pneumonia, then start the doxycycline today.

## 2022-11-16 NOTE — ED Triage Notes (Addendum)
Pt c/o cough that started Saturday. States he flew back from [place] Saturday and threw up on the flight too  Pt took at home COVID test that was negative

## 2022-11-16 NOTE — ED Provider Notes (Signed)
HPI  SUBJECTIVE:  Curtis Duran is a 67 y.o. male who presents with 5 days of a productive cough of yellow, blood-streaked sputum, nasal congestion, yellowish bloody rhinorrhea, wheezing at night.  He just returned from a trip to [place].  No fevers, body aches, headaches, sinus pain or pressure, postnasal drip, shortness of breath, new or different dyspnea on exertion, upper dental pain, facial swelling.  He reports 1 episode of posttussive emesis.  He reports chest soreness secondary to the cough and is unable to sleep at night because of the cough.  No nausea, diarrhea, abdominal pain.  He had a negative home COVID test yesterday.  No double sickening.  No known COVID or flu exposure.  He got 3 doses of the COVID-vaccine and this years flu vaccine.  No antibiotics in the past 3 months.  No antipyretic in the past 6 hours.  He has tried Coricidin HBP, TheraFlu and Mucinex.  TheraFlu helps.  No aggravating factors.  Patient has a past medical history of pneumonia, diabetes, hyperlipidemia, hypertension, glaucoma.  No history of chronic kidney disease.  PCP: [facility].  Past Medical History:  Diagnosis Date   Anxiety    Bronchial pneumonia    back in January   Carpal tunnel syndrome, bilateral    Depression    Diabetes mellitus without complication (HCC)    Eczema    FRONT OF CHEST, MIDDLE OF BACK, FRONT OF LEGS   GERD (gastroesophageal reflux disease)    Glaucoma    sees Dr. Eliseo Gum at [facility].   Heart murmur    as child    Hyperlipidemia    Lightheadedness    Low back pain    Sleep apnea    wears cpap     Past Surgical History:  Procedure Laterality Date   CERVICAL SPINE SURGERY  2014   per Dr. Coletta Memos   COLONOSCOPY  05/05/2021   per Dr. Myrtie Neither, adenomatous polyp,repeat in 5 yrs   LEFT HEART CATH AND CORONARY ANGIOGRAPHY N/A 10/14/2021   Procedure: LEFT HEART CATH AND CORONARY ANGIOGRAPHY;  Surgeon: Corky Crafts, MD;  Location: [facility] INVASIVE  CV LAB;  Service: Cardiovascular;  Laterality: N/A;   LIPOMA EXCISION N/A 12/01/2015   Procedure: EXCISION OF LIPOMA OF BACK;  Surgeon: Harriette Bouillon, MD;  Location: [facility] OR;  Service: General;  Laterality: N/A;   POLYPECTOMY      Family History  Problem Relation Age of Onset   Cancer Father    Diabetes Other    Colon polyps Neg Hx    Esophageal cancer Neg Hx    Rectal cancer Neg Hx    Stomach cancer Neg Hx    Colon cancer Neg Hx     Social History   Tobacco Use   Smoking status: Never   Smokeless tobacco: Never  Vaping Use   Vaping status: Never Used  Substance Use Topics   Alcohol use: No    Alcohol/week: 0.0 standard drinks of alcohol   Drug use: No    No current facility-administered medications for this encounter.  Current Outpatient Medications:    albuterol (VENTOLIN HFA) 108 (90 Base) MCG/ACT inhaler, Inhale 1-2 puffs into the lungs every 4 (four) hours as needed for wheezing or shortness of breath., Disp: 1 each, Rfl: 0   doxycycline (VIBRAMYCIN) 100 MG capsule, Take 1 capsule (100 mg total) by mouth 2 (two) times daily for 10 days., Disp: 20 capsule, Rfl: 0   promethazine-dextromethorphan (PROMETHAZINE-DM) 6.25-15 MG/5ML syrup,  Take 5 mLs by mouth 4 (four) times daily as needed for cough., Disp: 118 mL, Rfl: 0   Spacer/Aero-Holding Chambers (AEROCHAMBER MV) inhaler, Use as instructed, Disp: 1 each, Rfl: 1   aspirin EC 81 MG tablet, Take 81 mg by mouth daily. Swallow whole., Disp: , Rfl:    atorvastatin (LIPITOR) 10 MG tablet, Take 1 tablet (10 mg total) by mouth daily., Disp: 90 tablet, Rfl: 3   buPROPion (WELLBUTRIN XL) 300 MG 24 hr tablet, TAKE 1 TABLET BY MOUTH EVERY DAY, Disp: 90 tablet, Rfl: 0   celecoxib (CELEBREX) 200 MG capsule, TAKE 1 CAPSULE BY MOUTH TWICE A DAY, Disp: 180 capsule, Rfl: 3   cholecalciferol (VITAMIN D3) 25 MCG (1000 UNIT) tablet, Take 1,000 Units by mouth daily., Disp: , Rfl:    cyclobenzaprine (FLEXERIL) 10 MG tablet, Take 1 tablet (10 mg  total) by mouth 3 (three) times daily as needed for muscle spasms., Disp: 60 tablet, Rfl: 1   dorzolamide-timolol (COSOPT) 22.3-6.8 MG/ML ophthalmic solution, Place 1 drop into both eyes daily. (Patient not taking: Reported on 11/16/2022), Disp: 10 mL, Rfl: 0   DULoxetine (CYMBALTA) 30 MG capsule, Take 1 capsule (30 mg total) by mouth daily., Disp: 90 capsule, Rfl: 3   gabapentin (NEURONTIN) 100 MG capsule, Take 1 capsule (100 mg total) by mouth 2 (two) times daily., Disp: 60 capsule, Rfl: 3   latanoprost (XALATAN) 0.005 % ophthalmic solution, Place 1 drop into both eyes at bedtime., Disp: , Rfl:    losartan-hydrochlorothiazide (HYZAAR) 50-12.5 MG tablet, Take 1 tablet by mouth daily., Disp: 90 tablet, Rfl: 3   metFORMIN (GLUCOPHAGE) 500 MG tablet, TAKE 1 TABLET BY MOUTH 2 TIMES DAILY WITH A MEAL., Disp: 180 tablet, Rfl: 3   metoprolol tartrate (LOPRESSOR) 25 MG tablet, Take 0.5 tablets (12.5 mg total) by mouth 2 (two) times daily., Disp: 90 tablet, Rfl: 3   nitroGLYCERIN (NITROSTAT) 0.4 MG SL tablet, Place 1 tablet (0.4 mg total) under the tongue every 5 (five) minutes as needed for chest pain., Disp: 25 tablet, Rfl: 6   omeprazole (PRILOSEC) 40 MG capsule, TAKE 1 CAPSULE BY MOUTH TWICE A DAY, Disp: 180 capsule, Rfl: 0   RHOPRESSA 0.02 % SOLN, Place 1 drop into both eyes every morning., Disp: , Rfl:   Allergies  Allergen Reactions   Citalopram Hydrobromide Rash   Crestor [Rosuvastatin] Rash   Other Rash    Coban     ROS  As noted in HPI.   Physical Exam  BP 125/77 (BP Location: Left Arm)   Pulse 95   Temp 98.5 F (36.9 C) (Oral)   Resp 18   SpO2 94%   Constitutional: Well developed, well nourished, no acute distress Eyes:  EOMI, conjunctiva normal bilaterally HENT: Normocephalic, atraumatic,mucus membranes moist.  Yellowish-green nasal crusting.  Normal turbinates.  No maxillary, frontal sinus tenderness. Neck: No cervical lymphadenopathy Respiratory: Normal inspiratory effort,  lungs clear bilaterally.  Positive lateral chest wall tenderness Cardiovascular: Normal rate, regular rhythm, no murmurs rubs or gallops GI: nondistended skin: No rash, skin intact Musculoskeletal: no deformities Neurologic: Alert & oriented x 3, no focal neuro deficits Psychiatric: Speech and behavior appropriate   ED Course   Medications - No data to display  Orders Placed This Encounter  Procedures   SARS CORONAVIRUS 2 (TAT 6-24 HRS) Anterior Nasal Swab    Standing Status:   Standing    Number of Occurrences:   1   DG Chest 2 View    Standing Status:  Standing    Number of Occurrences:   1    Order Specific Question:   Reason for Exam (SYMPTOM  OR DIAGNOSIS REQUIRED)    Answer:   Productive cough 5 days rule out pneumonia   Basic metabolic panel    Standing Status:   Standing    Number of Occurrences:   1    Results for orders placed or performed during the hospital encounter of 11/16/22 (from the past 24 hour(s))  SARS CORONAVIRUS 2 (TAT 6-24 HRS) Anterior Nasal Swab     Status: None   Collection Time: 11/16/22  5:10 PM   Specimen: Anterior Nasal Swab  Result Value Ref Range   SARS Coronavirus 2 NEGATIVE NEGATIVE  Basic metabolic panel     Status: Abnormal   Collection Time: 11/16/22  5:35 PM  Result Value Ref Range   Sodium 136 135 - 145 mmol/L   Potassium 3.5 3.5 - 5.1 mmol/L   Chloride 102 98 - 111 mmol/L   CO2 23 22 - 32 mmol/L   Glucose, Bld 116 (H) 70 - 99 mg/dL   BUN 14 8 - 23 mg/dL   Creatinine, Ser 9.60 0.61 - 1.24 mg/dL   Calcium 9.2 8.9 - 45.4 mg/dL   GFR, Estimated >09 >81 mL/min   Anion gap 11 5 - 15   DG Chest 2 View  Result Date: 11/16/2022 CLINICAL DATA:  Productive cough for 5 days. EXAM: CHEST - 2 VIEW COMPARISON:  01/15/2020 FINDINGS: The cardiomediastinal contours are normal. Chronic elevation of right hemidiaphragm. Pulmonary vasculature is normal. No consolidation, pleural effusion, or pneumothorax. No acute osseous abnormalities are  seen. IMPRESSION: No active cardiopulmonary disease. Electronically Signed   By: Narda Rutherford M.D.   On: 11/16/2022 18:22    ED Clinical Impression  1. Lower respiratory tract infection   2. Encounter for laboratory testing for COVID-19 virus      ED Assessment/Plan     Checking COVID, chest x-ray given history of pneumonia, BMP.  Last blood work was in February.  Patient qualifies for antivirals if COVID is positive, but would need to start them today.  Reviewed imaging independently.  No consolidation as read by me.  Formal radiology report pending.  Discussed this with patient.  Will contact patient if radiology overread differs enough from mine and we need to change management.  No obvious pneumonia on x-ray.  Patient presents with a lower respiratory tract infection. Plan to send home with a regularly scheduled albuterol inhaler with a spacer for 4 days, then as needed thereafter, saline nasal irrigation, Mucinex, Promethazine DM, wait-and-see prescription of doxycycline for 10 days which will treat both pneumonia and any possible sinusitis if he gets worse despite conservative treatment.  Reviewed radiology report.  No pneumonia.  BMP unremarkable.  GFR above 60.  COVID-negative.  Plan as above.  Discussed labs, imaging, MDM, treatment plan, and plan for follow-up with patient. Discussed sn/sx that should prompt return to the ED. patient agrees with plan.   Meds ordered this encounter  Medications   albuterol (VENTOLIN HFA) 108 (90 Base) MCG/ACT inhaler    Sig: Inhale 1-2 puffs into the lungs every 4 (four) hours as needed for wheezing or shortness of breath.    Dispense:  1 each    Refill:  0   Spacer/Aero-Holding Chambers (AEROCHAMBER MV) inhaler    Sig: Use as instructed    Dispense:  1 each    Refill:  1   promethazine-dextromethorphan (PROMETHAZINE-DM) 6.25-15 MG/5ML  syrup    Sig: Take 5 mLs by mouth 4 (four) times daily as needed for cough.    Dispense:  118 mL     Refill:  0   doxycycline (VIBRAMYCIN) 100 MG capsule    Sig: Take 1 capsule (100 mg total) by mouth 2 (two) times daily for 10 days.    Dispense:  20 capsule    Refill:  0      *This clinic note was created using Scientist, clinical (histocompatibility and immunogenetics). Therefore, there may be occasional mistakes despite careful proofreading.  ?    Domenick Gong, MD 11/17/22 [phone]

## 2022-11-17 LAB — SARS CORONAVIRUS 2 (TAT 6-24 HRS): SARS Coronavirus 2: NEGATIVE

## 2022-11-21 DIAGNOSIS — H2513 Age-related nuclear cataract, bilateral: Secondary | ICD-10-CM | POA: Diagnosis not present

## 2022-11-21 DIAGNOSIS — H5213 Myopia, bilateral: Secondary | ICD-10-CM | POA: Diagnosis not present

## 2022-11-21 DIAGNOSIS — H401131 Primary open-angle glaucoma, bilateral, mild stage: Secondary | ICD-10-CM | POA: Diagnosis not present

## 2022-11-24 ENCOUNTER — Other Ambulatory Visit: Payer: Self-pay | Admitting: Family Medicine

## 2023-01-08 ENCOUNTER — Other Ambulatory Visit: Payer: Self-pay | Admitting: Family Medicine

## 2023-01-12 ENCOUNTER — Other Ambulatory Visit: Payer: Self-pay | Admitting: Family Medicine

## 2023-01-25 ENCOUNTER — Encounter: Payer: Self-pay | Admitting: Physician Assistant

## 2023-01-25 ENCOUNTER — Ambulatory Visit: Payer: BC Managed Care – PPO | Attending: Physician Assistant | Admitting: Physician Assistant

## 2023-01-25 VITALS — BP 120/80 | HR 104 | Ht 76.0 in | Wt 271.6 lb

## 2023-01-25 DIAGNOSIS — I2510 Atherosclerotic heart disease of native coronary artery without angina pectoris: Secondary | ICD-10-CM | POA: Insufficient documentation

## 2023-01-25 DIAGNOSIS — R000 Tachycardia, unspecified: Secondary | ICD-10-CM | POA: Diagnosis not present

## 2023-01-25 DIAGNOSIS — E7800 Pure hypercholesterolemia, unspecified: Secondary | ICD-10-CM | POA: Diagnosis not present

## 2023-01-25 DIAGNOSIS — I10 Essential (primary) hypertension: Secondary | ICD-10-CM

## 2023-01-25 DIAGNOSIS — R1310 Dysphagia, unspecified: Secondary | ICD-10-CM

## 2023-01-25 DIAGNOSIS — R0602 Shortness of breath: Secondary | ICD-10-CM

## 2023-01-25 HISTORY — DX: Atherosclerotic heart disease of native coronary artery without angina pectoris: I25.10

## 2023-01-25 MED ORDER — METOPROLOL TARTRATE 25 MG PO TABS: 25.0000 mg | ORAL_TABLET | Freq: Two times a day (BID) | ORAL | 3 refills | Status: DC

## 2023-01-25 NOTE — Patient Instructions (Signed)
 Medication Instructions:  Your physician has recommended you make the following change in your medication:   INCREASE Metoprolol  to 25 taking 1 whole tablet twice a day  *If you need a refill on your cardiac medications before your next appointment, please call your pharmacy*   Lab Work: TODAY:  BMET, PRO BNP, CBC, & TSH  If you have labs (blood work) drawn today and your tests are completely normal, you will receive your results only by: MyChart Message (if you have MyChart) OR A paper copy in the mail If you have any lab test that is abnormal or we need to change your treatment, we will call you to review the results.   Testing/Procedures: None ordered   Follow-Up: At [facility], you and your health needs are our priority.  As part of our continuing mission to provide you with exceptional heart care, we have created designated Provider Care Teams.  These Care Teams include your primary Cardiologist (physician) and Advanced Practice Providers (APPs -  Physician Assistants and Nurse Practitioners) who all work together to provide you with the care you need, when you need it.  We recommend signing up for the patient portal called MyChart.  Sign up information is provided on this After Visit Summary.  MyChart is used to connect with patients for Virtual Visits (Telemedicine).  Patients are able to view lab/test results, encounter notes, upcoming appointments, etc.  Non-urgent messages can be sent to your provider as well.   To learn more about what you can do with MyChart, go to [email or web address].    Your next appointment:   6 month(s)  Provider:   Stanly DELENA Leavens, MD     Other Instructions

## 2023-01-25 NOTE — Progress Notes (Signed)
 Cardiology Office Note:    Date:  01/25/2023  ID:  SAAGAR TORTORELLA, DOB [birth year], MRN [id number] PCP: Johnny Garnette LABOR, MD  [facility] Providers Cardiologist:  Stanly LABOR Leavens, MD       Patient Profile:      Coronary artery disease Cath 10/14/2021: min nonobstructive coronary plaque, EF 55-65  Severe R subclavian tortuosity - Need R fem artery approach if cath needed in future TTE 11/24/2021: EF 60-65, no RWMA, mild LVH, Gr 1 DD, NL RVSF, trivial MR FHx of CAD  One Bro w MI x 2; Another Bro ? w MI Diabetes mellitus  Hyperlipidemia  Hypertension  OSA Obesity          History of Present Illness:  Discussed the use of AI scribe software for clinical note transcription with the patient, who gave verbal consent to proceed.  Curtis Duran is a 68 y.o. male who returns for follow up of CAD. He was last seen by Dr. Dann in 02/2022. He is here alone. The patient reports a longstanding issue of discomfort in the left chest area after consuming certain foods, such as milk, ice cream, pasta, and salads. The sensation is described as food getting 'stuck,' but there is no associated vomiting. He has been prescribed Prilosec for acid reflux, which has provided some relief. However, the patient notes that the sensation of food getting stuck seems to be worsening. There has been no unintentional weight loss. The patient experiences shortness of breath when engaging in outdoor activities. This has been a longstanding issue and has not worsened recently. However, the patient feels that his breathing has become slightly more difficult since starting a new medication for an irregular heartbeat. The patient also reports occasional lightheadedness upon standing up from a seated position, but no episodes of fainting. He is able to lay flat without difficulty and there is no leg swelling. The patient does not smoke.     Review of Systems  Gastrointestinal:  Negative for hematochezia and  melena.  Genitourinary:  Negative for hematuria.  -See HPI     Studies Reviewed:   EKG Interpretation Date/Time:  Wednesday January 25 2023 15:29:39 EST Ventricular Rate:  104 PR Interval:  212 QRS Duration:  82 QT Interval:  334 QTC Calculation: 439 R Axis:   14  Text Interpretation: Sinus tachycardia with 1st degree A-V block Cannot rule out Inferior infarct , age undetermined No significant change since last tracing Confirmed by Lelon Hamilton [id number]) on 01/25/2023 3:49:21 PM          Risk Assessment/Calculations:             Physical Exam:   VS:  BP 120/80   Pulse (!) 104   Ht 6' 4 (1.93 m)   Wt 271 lb 9.6 oz (123.2 kg)   SpO2 97%   BMI 33.06 kg/m    Wt Readings from Last 3 Encounters:  01/25/23 271 lb 9.6 oz (123.2 kg)  07/22/22 263 lb (119.3 kg)  03/02/22 277 lb (125.6 kg)    Constitutional:      Appearance: Healthy appearance. Not in distress.  Neck:     Vascular: JVD normal.  Pulmonary:     Breath sounds: Normal breath sounds. No wheezing. No rales.  Cardiovascular:     Normal rate. Regular rhythm.     Murmurs: There is no murmur.  Edema:    Peripheral edema absent.  Abdominal:     Palpations: Abdomen is soft.  Assessment and Plan:   Assessment & Plan Coronary artery calcification Min nonobstructive coronary plaque on cardiac catheterization in 09/2021. He note fairly chronic dyspnea on exertion. He has not had exertional chest pain to suggest angina.  -Continue ASA 81 mg once daily, Atorvastatin    Pure hypercholesterolemia Managed by primary care with Atorvastatin  10 mg once daily. LDL goal < 70.  Primary hypertension BP controlled.  -Continue Hyzaar 50/12.5 mg once daily, Metoprolol  tartrate  Shortness of breath He had normal EF and mild diastolic dysfunction on TTE 11/2021. He notes dyspnea on exertion. I suspect this is multifactorial and related to deconditioning, obesity, neuropathy from diabetes. He does not appear volume overloaded  on exam.  -BMET, NT Pro BNP -If BNP elevated, add Lasix and get follow up Echocardiogram   Sinus tachycardia He has a high basal HR. Question if this is contributing to dyspnea on exertion. Cath in 09/2021 showed minimal nonobstructive plaque and a TTE in 11/2021 showed normal EF.  -Increase Metoprolol  tartrate to 25 mg twice daily  -BMET, CBC, TSH  Dysphagia, unspecified type He notes continued symptoms that seem to be getting worse. He is already on PPI. -Refer to GI        Dispo:  Return in about 6 months (around 07/25/2023) for Routine Follow Up, w/ Dr. Santo, or Glendia Ferrier, PA-C.  Signed, Glendia Ferrier, PA-C

## 2023-01-25 NOTE — Assessment & Plan Note (Signed)
 BP controlled.  -Continue Hyzaar 50/12.5 mg once daily, Metoprolol tartrate

## 2023-01-25 NOTE — Assessment & Plan Note (Signed)
 Min nonobstructive coronary plaque on cardiac catheterization in 09/2021. He note fairly chronic dyspnea on exertion. He has not had exertional chest pain to suggest angina.  -Continue ASA 81 mg once daily, Atorvastatin

## 2023-01-25 NOTE — Assessment & Plan Note (Signed)
 Managed by primary care with Atorvastatin 10 mg once daily. LDL goal < 70.

## 2023-01-26 LAB — CBC
Hematocrit: 45.6 % (ref 37.5–51.0)
Hemoglobin: 15.3 g/dL (ref 13.0–17.7)
MCH: 30.8 pg (ref 26.6–33.0)
MCHC: 33.6 g/dL (ref 31.5–35.7)
MCV: 92 fL (ref 79–97)
Platelets: 313 10*3/uL (ref 150–450)
RBC: 4.96 x10E6/uL (ref 4.14–5.80)
RDW: 13 % (ref 11.6–15.4)
WBC: 8.8 10*3/uL (ref 3.4–10.8)

## 2023-01-26 LAB — BASIC METABOLIC PANEL
BUN/Creatinine Ratio: 17 (ref 10–24)
BUN: 18 mg/dL (ref 8–27)
CO2: 25 mmol/L (ref 20–29)
Calcium: 9.6 mg/dL (ref 8.6–10.2)
Chloride: 101 mmol/L (ref 96–106)
Creatinine, Ser: 1.07 mg/dL (ref 0.76–1.27)
Glucose: 110 mg/dL — ABNORMAL HIGH (ref 70–99)
Potassium: 4.2 mmol/L (ref 3.5–5.2)
Sodium: 141 mmol/L (ref 134–144)
eGFR: 75 mL/min/{1.73_m2} (ref >59)

## 2023-01-26 LAB — TSH: TSH: 1.61 u[IU]/mL (ref 0.450–4.500)

## 2023-01-26 LAB — PRO B NATRIURETIC PEPTIDE: NT-Pro BNP: <36 pg/mL (ref 0–376)

## 2023-02-04 ENCOUNTER — Other Ambulatory Visit: Payer: Self-pay | Admitting: Family Medicine

## 2023-02-13 ENCOUNTER — Ambulatory Visit: Payer: BC Managed Care – PPO | Admitting: Physician Assistant

## 2023-02-20 ENCOUNTER — Ambulatory Visit (INDEPENDENT_AMBULATORY_CARE_PROVIDER_SITE_OTHER): Payer: BC Managed Care – PPO | Admitting: Family Medicine

## 2023-02-20 ENCOUNTER — Encounter: Payer: Self-pay | Admitting: Family Medicine

## 2023-02-20 ENCOUNTER — Other Ambulatory Visit: Payer: Self-pay | Admitting: Family Medicine

## 2023-02-20 VITALS — BP 124/74 | HR 97 | Temp 97.8°F | Ht 76.0 in | Wt 268.0 lb

## 2023-02-20 DIAGNOSIS — Z0000 Encounter for general adult medical examination without abnormal findings: Secondary | ICD-10-CM

## 2023-02-20 DIAGNOSIS — R1313 Dysphagia, pharyngeal phase: Secondary | ICD-10-CM | POA: Diagnosis not present

## 2023-02-20 DIAGNOSIS — R972 Elevated prostate specific antigen [PSA]: Secondary | ICD-10-CM | POA: Diagnosis not present

## 2023-02-20 LAB — CBC WITH DIFFERENTIAL/PLATELET
Basophils Absolute: 0 10*3/uL (ref 0.0–0.1)
Basophils Relative: 0.6 % (ref 0.0–3.0)
Eosinophils Absolute: 0.3 10*3/uL (ref 0.0–0.7)
Eosinophils Relative: 3.7 % (ref 0.0–5.0)
HCT: 46.1 % (ref 39.0–52.0)
Hemoglobin: 15.3 g/dL (ref 13.0–17.0)
Lymphocytes Relative: 29.3 % (ref 12.0–46.0)
Lymphs Abs: 2.2 10*3/uL (ref 0.7–4.0)
MCHC: 33.2 g/dL (ref 30.0–36.0)
MCV: 92.6 fL (ref 78.0–100.0)
Monocytes Absolute: 0.8 10*3/uL (ref 0.1–1.0)
Monocytes Relative: 11.1 % (ref 3.0–12.0)
Neutro Abs: 4.2 10*3/uL (ref 1.4–7.7)
Neutrophils Relative %: 55.3 % (ref 43.0–77.0)
Platelets: 285 10*3/uL (ref 150.0–400.0)
RBC: 4.97 Mil/uL (ref 4.22–5.81)
RDW: 14.4 % (ref 11.5–15.5)
WBC: 7.7 10*3/uL (ref 4.0–10.5)

## 2023-02-20 LAB — HEPATIC FUNCTION PANEL
ALT: 41 U/L (ref 0–53)
AST: 32 U/L (ref 0–37)
Albumin: 4.4 g/dL (ref 3.5–5.2)
Alkaline Phosphatase: 43 U/L (ref 39–117)
Bilirubin, Direct: 0.1 mg/dL (ref 0.0–0.3)
Total Bilirubin: 0.5 mg/dL (ref 0.2–1.2)
Total Protein: 7 g/dL (ref 6.0–8.3)

## 2023-02-20 LAB — BASIC METABOLIC PANEL
BUN: 19 mg/dL (ref 6–23)
CO2: 28 meq/L (ref 19–32)
Calcium: 9.5 mg/dL (ref 8.4–10.5)
Chloride: 102 meq/L (ref 96–112)
Creatinine, Ser: 1.19 mg/dL (ref 0.40–1.50)
GFR: 62.52 mL/min (ref >60.00)
Glucose, Bld: 102 mg/dL — ABNORMAL HIGH (ref 70–99)
Potassium: 4.2 meq/L (ref 3.5–5.1)
Sodium: 139 meq/L (ref 135–145)

## 2023-02-20 LAB — LIPID PANEL
Cholesterol: 168 mg/dL (ref 0–200)
HDL: 62.7 mg/dL (ref >39.00)
LDL Cholesterol: 83 mg/dL (ref 0–99)
NonHDL: 105.24
Total CHOL/HDL Ratio: 3
Triglycerides: 110 mg/dL (ref 0.0–149.0)
VLDL: 22 mg/dL (ref 0.0–40.0)

## 2023-02-20 LAB — HEMOGLOBIN A1C: Hgb A1c MFr Bld: 6.4 % (ref 4.6–6.5)

## 2023-02-20 LAB — PSA: PSA: 5.16 ng/mL — ABNORMAL HIGH (ref 0.10–4.00)

## 2023-02-20 LAB — TSH: TSH: 1.8 u[IU]/mL (ref 0.35–5.50)

## 2023-02-20 MED ORDER — CYCLOBENZAPRINE HCL 10 MG PO TABS: 10.0000 mg | ORAL_TABLET | Freq: Three times a day (TID) | ORAL | 5 refills | Status: AC | PRN

## 2023-02-20 MED ORDER — CELECOXIB 200 MG PO CAPS: 200.0000 mg | ORAL_CAPSULE | Freq: Two times a day (BID) | ORAL | 3 refills | Status: DC

## 2023-02-20 MED ORDER — BUPROPION HCL ER (XL) 300 MG PO TB24: 300.0000 mg | ORAL_TABLET | Freq: Every day | ORAL | 3 refills | Status: AC

## 2023-02-20 MED ORDER — OMEPRAZOLE 40 MG PO CPDR: 40.0000 mg | DELAYED_RELEASE_CAPSULE | Freq: Every day | ORAL | Status: DC

## 2023-02-20 NOTE — Addendum Note (Signed)
Addended by: Gershon Crane A on: 02/20/2023 05:05 PM   Modules accepted: Orders

## 2023-02-20 NOTE — Progress Notes (Signed)
Subjective:    Patient ID: Curtis Duran, male    DOB: 1955-12-29, 68 y.o.   MRN: [id number]  HPI Here for a well exam. He is doing well in general. His depression is well managed. He does mention trouble swallowing for the past 3 months. He says when he starts to swallow, the food will frequently start to go down his windpipe. This chokes him until he coughs the food out. No heartburn. He still takes Omeprazole daily. He also describes itchy flaky skin on his forehead and scalp for the past 6 months. He is using an OTC moisturizer fro this.    Review of Systems  Constitutional: Negative.   HENT:  Positive for trouble swallowing.   Eyes: Negative.   Respiratory: Negative.    Cardiovascular: Negative.   Gastrointestinal: Negative.   Genitourinary: Negative.   Musculoskeletal: Negative.   Skin:  Positive for rash.  Neurological: Negative.   Psychiatric/Behavioral: Negative.         Objective:   Physical Exam Constitutional:      General: He is not in acute distress.    Appearance: Normal appearance. He is well-developed. He is not diaphoretic.  HENT:     Head: Normocephalic and atraumatic.     Right Ear: External ear normal.     Left Ear: External ear normal.     Nose: Nose normal.     Mouth/Throat:     Pharynx: No oropharyngeal exudate.  Eyes:     General: No scleral icterus.       Right eye: No discharge.        Left eye: No discharge.     Conjunctiva/sclera: Conjunctivae normal.     Pupils: Pupils are equal, round, and reactive to light.  Neck:     Thyroid: No thyromegaly.     Vascular: No JVD.     Trachea: No tracheal deviation.  Cardiovascular:     Rate and Rhythm: Normal rate and regular rhythm.     Pulses: Normal pulses.     Heart sounds: Normal heart sounds. No murmur heard.    No friction rub. No gallop.  Pulmonary:     Effort: Pulmonary effort is normal. No respiratory distress.     Breath sounds: Normal breath sounds. No wheezing or rales.  Chest:      Chest wall: No tenderness.  Abdominal:     General: Bowel sounds are normal. There is no distension.     Palpations: Abdomen is soft. There is no mass.     Tenderness: There is no abdominal tenderness. There is no guarding or rebound.  Genitourinary:    Penis: Normal. No tenderness.      Testes: Normal.     Prostate: Normal.     Rectum: Normal. Guaiac result negative.  Musculoskeletal:        General: No tenderness. Normal range of motion.     Cervical back: Neck supple.  Lymphadenopathy:     Cervical: No cervical adenopathy.  Skin:    General: Skin is warm and dry.     Coloration: Skin is not pale.     Findings: No erythema or rash.  Neurological:     General: No focal deficit present.     Mental Status: He is alert and oriented to person, place, and time.     Cranial Nerves: No cranial nerve deficit.     Motor: No abnormal muscle tone.     Coordination: Coordination normal.     Deep Tendon  Reflexes: Reflexes are normal and symmetric. Reflexes normal.  Psychiatric:        Mood and Affect: Mood normal.        Behavior: Behavior normal.        Thought Content: Thought content normal.        Judgment: Judgment normal.           Assessment & Plan:  Well exam. We discussed diet and exercise. Get fasting labs. He has some dysphagia, so we will refer him to ENT to evaluate. He also has some eczema on the face and scalp, so he will try applying OTC cortisone cream.  Gershon Crane, MD

## 2023-03-21 DIAGNOSIS — H401131 Primary open-angle glaucoma, bilateral, mild stage: Secondary | ICD-10-CM | POA: Diagnosis not present

## 2023-03-21 DIAGNOSIS — H18412 Arcus senilis, left eye: Secondary | ICD-10-CM | POA: Diagnosis not present

## 2023-04-03 DIAGNOSIS — R972 Elevated prostate specific antigen [PSA]: Secondary | ICD-10-CM | POA: Diagnosis not present

## 2023-04-03 DIAGNOSIS — R3912 Poor urinary stream: Secondary | ICD-10-CM | POA: Diagnosis not present

## 2023-04-03 DIAGNOSIS — N401 Enlarged prostate with lower urinary tract symptoms: Secondary | ICD-10-CM | POA: Diagnosis not present

## 2023-04-03 DIAGNOSIS — R3915 Urgency of urination: Secondary | ICD-10-CM | POA: Diagnosis not present

## 2023-04-04 ENCOUNTER — Other Ambulatory Visit: Payer: Self-pay | Admitting: Urology

## 2023-04-04 DIAGNOSIS — R972 Elevated prostate specific antigen [PSA]: Secondary | ICD-10-CM

## 2023-04-06 ENCOUNTER — Other Ambulatory Visit: Payer: Self-pay | Admitting: Family Medicine

## 2023-04-10 ENCOUNTER — Encounter (INDEPENDENT_AMBULATORY_CARE_PROVIDER_SITE_OTHER): Payer: Self-pay | Admitting: Otolaryngology

## 2023-04-10 ENCOUNTER — Other Ambulatory Visit (HOSPITAL_COMMUNITY): Payer: Self-pay | Admitting: Otolaryngology

## 2023-04-10 ENCOUNTER — Ambulatory Visit (INDEPENDENT_AMBULATORY_CARE_PROVIDER_SITE_OTHER): Payer: BC Managed Care – PPO | Admitting: Otolaryngology

## 2023-04-10 VITALS — BP 129/78 | HR 112 | Ht 76.0 in | Wt 262.0 lb

## 2023-04-10 DIAGNOSIS — T17320A Food in larynx causing asphyxiation, initial encounter: Secondary | ICD-10-CM

## 2023-04-10 DIAGNOSIS — R059 Cough, unspecified: Secondary | ICD-10-CM

## 2023-04-10 DIAGNOSIS — R1310 Dysphagia, unspecified: Secondary | ICD-10-CM

## 2023-04-10 DIAGNOSIS — G4733 Obstructive sleep apnea (adult) (pediatric): Secondary | ICD-10-CM | POA: Diagnosis not present

## 2023-04-10 DIAGNOSIS — W44F3XA Food entering into or through a natural orifice, initial encounter: Secondary | ICD-10-CM

## 2023-04-10 DIAGNOSIS — J383 Other diseases of vocal cords: Secondary | ICD-10-CM

## 2023-04-10 DIAGNOSIS — K219 Gastro-esophageal reflux disease without esophagitis: Secondary | ICD-10-CM | POA: Diagnosis not present

## 2023-04-10 NOTE — Patient Instructions (Signed)

## 2023-04-10 NOTE — Progress Notes (Signed)
 ENT CONSULT:  Reason for Consult: dysphagia  choking on foods   HPI: Discussed the use of AI scribe software for clinical note transcription with the patient, who gave verbal consent to proceed.  History of Present Illness Curtis Duran is a 68 year old male with hx of GERD on Prilosec, T2DM, who presents with difficulty swallowing.  He experiences difficulty swallowing both solids and liquids, leading to coughing and choking. This issue occurs daily and has persisted for two to three years. He has not altered his diet due to this problem and has not undergone a swallow study previously. No weight loss, no aspiration PNA hx. No significant voice changes. No hx of stroke, heart attack, or critical illness prior to the onset of his swallowing difficulties.  He is currently taking a medication for heartburn or reflux, on Prilosec 40 mg taken every morning. He has OSA, but does not use a CPAP machine for sleep apnea as he finds it difficult to tolerate and is not compliant with its use. A previous home sleep test was problematic due to the equipment coming loose, resulting in an apnea-hypopnea index (AHI) of 15.1, indicating moderate sleep apnea.  He has a history of bronchial pneumonia 2 yrs ago. No shortness of breath at rest. He has a history of undergoing a cardiac catheterization in 2023, which did not result in stenting due to the absence of significant blockage.   Records Reviewed:  PCP office visit 02/20/23 Well exam. We discussed diet and exercise. Get fasting labs. He has some dysphagia, so we will refer him to ENT to evaluate. He also has some eczema on the face and scalp, so he will try applying OTC cortisone cream    Past Medical History:  Diagnosis Date   Anxiety    Bronchial pneumonia    back in January   Carpal tunnel syndrome, bilateral    Coronary artery calcification 01/25/2023   Cath 08/2021: min nonobstructive coronary plaque  Severe R subclavian tortuosity - Need R fem  artery approach if cath needed in futureFHx of CAD  One Bro w MI x 2; Another Bro ? w MI    Depression    Diabetes mellitus without complication (HCC)    Eczema    FRONT OF CHEST, MIDDLE OF BACK, FRONT OF LEGS   GERD (gastroesophageal reflux disease)    Glaucoma    sees Dr. Eliseo Gum at [facility].   Heart murmur    as child    Hyperlipidemia    Lightheadedness    Low back pain    Sleep apnea    wears cpap     Past Surgical History:  Procedure Laterality Date   CERVICAL SPINE SURGERY  2014   per Dr. Coletta Memos   COLONOSCOPY  05/05/2021   per Dr. Myrtie Neither, adenomatous polyp,repeat in 5 yrs   LEFT HEART CATH AND CORONARY ANGIOGRAPHY N/A 10/14/2021   Procedure: LEFT HEART CATH AND CORONARY ANGIOGRAPHY;  Surgeon: Corky Crafts, MD;  Location[facility] INVASIVE CV LAB;  Service: Cardiovascular;  Laterality: N/A;   LIPOMA EXCISION N/A 12/01/2015   Procedure: EXCISION OF LIPOMA OF BACK;  Surgeon: Harriette Bouillon, MD;  Location: [facility] OR;  Service: General;  Laterality: N/A;   POLYPECTOMY      Family History  Problem Relation Age of Onset   Cancer Father    Diabetes Other    Colon polyps Neg Hx    Esophageal cancer Neg Hx    Rectal cancer Neg Hx  Stomach cancer Neg Hx    Colon cancer Neg Hx     Social History:  reports that he has never smoked. He has never used smokeless tobacco. He reports that he does not drink alcohol and does not use drugs.  Allergies:  Allergies  Allergen Reactions   Citalopram Hydrobromide Rash   Crestor [Rosuvastatin] Rash   Other Rash    Coban    Medications: I have reviewed the patient's current medications.  The PMH, PSH, Medications, Allergies, and SH were reviewed and updated.  ROS: Constitutional: Negative for fever, weight loss and weight gain. Cardiovascular: Negative for chest pain and dyspnea on exertion. Respiratory: Is not experiencing shortness of breath at rest. Gastrointestinal: Negative for nausea and  vomiting. Neurological: Negative for headaches. Psychiatric: The patient is not nervous/anxious  Blood pressure 129/78, pulse (!) 112, height 6\' 4"  (1.93 m), weight 262 lb (118.8 kg), SpO2 97%. Body mass index is 31.89 kg/m.  PHYSICAL EXAM:  Exam: General: Well-developed, well-nourished Communication and Voice: raspy Respiratory Respiratory effort: Equal inspiration and expiration without stridor Cardiovascular Peripheral Vascular: Warm extremities with equal color/perfusion Eyes: No nystagmus with equal extraocular motion bilaterally Neuro/Psych/Balance: Patient oriented to person, place, and time; Appropriate mood and affect; Gait is intact with no imbalance; Cranial nerves I-XII are intact Head and Face Inspection: Normocephalic and atraumatic without mass or lesion Palpation: Facial skeleton intact without bony stepoffs Salivary Glands: No mass or tenderness Facial Strength: Facial motility symmetric and full bilaterally ENT Pinna: External ear intact and fully developed External canal: Canal is patent with intact skin Tympanic Membrane: Clear and mobile External Nose: No scar or anatomic deformity Internal Nose: Septum is relatively straight. No polyp, or purulence. Mucosal edema and erythema present.  Bilateral inferior turbinate hypertrophy.  Lips, Teeth, and gums: Mucosa and teeth intact and viable TMJ: No pain to palpation with full mobility Oral cavity/oropharynx: No erythema or exudate, no lesions present Nasopharynx: No mass or lesion with intact mucosa Hypopharynx: Intact mucosa without pooling of secretions Larynx Glottic: Full true vocal cord mobility without lesion or mass b/l VF atrophy Supraglottic: Normal appearing epiglottis and AE folds Interarytenoid Space: Moderate pachydermia&edema Subglottic Space: Patent without lesion or edema Neck Neck and Trachea: Midline trachea without mass or lesion Thyroid: No mass or nodularity Lymphatics: No  lymphadenopathy  Procedure: Preoperative diagnosis: dysphagia choking on foods and liquids  Postoperative diagnosis:   Same + b/l VF atrophy and GERD LPR  Procedure: Flexible fiberoptic laryngoscopy  Surgeon: Ashok Croon, MD  Anesthesia: Topical lidocaine and Afrin Complications: None Condition is stable throughout exam  Indications and consent:  The patient presents to the clinic with above symptoms. Indirect laryngoscopy view was incomplete. Thus it was recommended that they undergo a flexible fiberoptic laryngoscopy. All of the risks, benefits, and potential complications were reviewed with the patient preoperatively and verbal informed consent was obtained.  Procedure: The patient was seated upright in the clinic. Topical lidocaine and Afrin were applied to the nasal cavity. After adequate anesthesia had occurred, I then proceeded to pass the flexible telescope into the nasal cavity. The nasal cavity was patent without rhinorrhea or polyp. The nasopharynx was also patent without mass or lesion. The base of tongue was visualized and was normal. There were no signs of pooling of secretions in the piriform sinuses. The true vocal folds were mobile bilaterally. There were no signs of glottic or supraglottic mucosal lesion or mass. There was moderate interarytenoid pachydermia and post cricoid edema. The telescope was then  slowly withdrawn and the patient tolerated the procedure throughout.   Studies Reviewed: CXR 11/16/22 The cardiomediastinal contours are normal. Chronic elevation of right hemidiaphragm. Pulmonary vasculature is normal. No consolidation, pleural effusion, or pneumothorax. No acute osseous abnormalities are seen.   IMPRESSION: No active cardiopulmonary disease  HST 2019  AHI of 15.1   Assessment/Plan: Encounter Diagnoses  Name Primary?   Dysphagia, unspecified type Yes   Choking due to food (regurgitated), initial encounter    Chronic GERD    Glottic  insufficiency    Age-related vocal fold atrophy     Assessment and Plan Assessment & Plan Dysphagia Chronic dysphagia with coughing and choking on both foods and liquids. Hx of GERD on Prilosec. Laryngoscopy showed thin vocal cords, suspect glottic insufficiency, his UES opened with swallowing during flexible scope, and there was no pooling of secretions in pyriforms. Differential includes oropharyngeal vs esophageal dysphagia, possible aspiration. - Order swallow study, MBS and esophagram - Provide information on reflux prevention and Reflux Gourmet supplement  - Consider gastroenterology referral for upper endoscopy if swallow study is normal. - Discuss swallow therapy and vocal cord injection if aspiration confirmed.  VF atrophy and glottic insufficiency suspected on scope exam - will consider strobe in the future  - will consider voice therapy vs VF injection augmentation in the future   GERD LPR - Continue Prilosec 40 mg 30 min prior to breakfast  -  Reflux Gourmet after meals - diet and lifestyle changes to minimize GERD - Refer to [employer] blog for dietary and lifestyle modifications/reflux cook book  Obstructive Sleep Apnea (OSA) Moderate OSA with AHI of 15.1 on HST 2019. Non-compliance with CPAP. Discussed surgical alternatives, specifically Inspire. Requires retesting due to outdated sleep study. - Provide brochure on surgical alternatives for sleep apnea. - Discuss potential retesting for sleep apnea to confirm current AHI. BMI today was 32.   Follow-up Follow-up needed to review swallow study results and discuss management. - Schedule follow-up appointment in 3 months to review swallow study results.      Thank you for allowing me to participate in the care of this patient. Please do not hesitate to contact me with any questions or concerns.   Ashok Croon, MD Otolaryngology [facility] Phone: [phone] Fax:  [phone]    04/10/2023, 10:09 AM

## 2023-04-11 DIAGNOSIS — R972 Elevated prostate specific antigen [PSA]: Secondary | ICD-10-CM | POA: Diagnosis not present

## 2023-04-11 DIAGNOSIS — N500 Atrophy of testis: Secondary | ICD-10-CM | POA: Diagnosis not present

## 2023-04-14 ENCOUNTER — Ambulatory Visit: Payer: BC Managed Care – PPO | Admitting: Physician Assistant

## 2023-04-14 ENCOUNTER — Encounter: Payer: Self-pay | Admitting: Physician Assistant

## 2023-04-14 VITALS — BP 112/72 | HR 90 | Ht 76.0 in | Wt 273.1 lb

## 2023-04-14 DIAGNOSIS — R1310 Dysphagia, unspecified: Secondary | ICD-10-CM

## 2023-04-14 NOTE — Patient Instructions (Signed)
 Continue omeprazole 40 mg twice daily.   _______________________________________________________  If your blood pressure at your visit was 140/90 or greater, please contact your primary care physician to follow up on this.  _______________________________________________________  If you are age 69 or older, your body mass index should be between 23-30. Your Body mass index is 33.25 kg/m. If this is out of the aforementioned range listed, please consider follow up with your Primary Care Provider.  If you are age 53 or younger, your body mass index should be between 19-25. Your Body mass index is 33.25 kg/m. If this is out of the aformentioned range listed, please consider follow up with your Primary Care Provider.   ________________________________________________________  The [facility] providers would like to encourage you to use [facility] to communicate with providers for non-urgent requests or questions.  Due to long hold times on the telephone, sending your provider a message by [facility] may be a faster and more efficient way to get a response.  Please allow 48 business hours for a response.  Please remember that this is for non-urgent requests.  _______________________________________________________

## 2023-04-14 NOTE — Progress Notes (Signed)
 Chief Complaint: Dysphagia  HPI:    Curtis Duran is a 68 year old African-American male with a past medical history as listed below including diabetes, GERD and multiple others, known to Dr. Myrtie Neither, who was referred to me by Nelwyn Salisbury, MD for a complaint of dysphagia.      10/15/2002 EGD was normal.    05/05/2021 colonoscopy with diverticulosis in the left and right colon and 1 diminutive polyp in the descending colon.  Repeat recommended 5 years.    02/20/2023 BMP with a glucose of 102 and otherwise normal, CBC, TSH hepatic function panel normal.  Hemoglobin A1c normal.    04/06/2023 patient saw ENT for dysphagia.  He had a laryngoscopy with moderate anterior to annoyed pachydermia and postcricoid edema, suspected glottic insufficiency.  At that time they ordered a swallow study, MBS and esophagram.  This is scheduled for April 1.    Today, patient tells me that over the past year and a half anytime he eats or drinks he feels like he goes down his windpipe no matter how slowly he eats or drinks or how he eats or drinks.  He has not lost any weight due to this and denies any abdominal pain.    Denies fever, chills, nausea or vomiting.  Past Medical History:  Diagnosis Date   Anxiety    Bronchial pneumonia    back in January   Carpal tunnel syndrome, bilateral    Coronary artery calcification 01/25/2023   Cath 08/2021: min nonobstructive coronary plaque  Severe R subclavian tortuosity - Need R fem artery approach if cath needed in futureFHx of CAD  One Bro w MI x 2; Another Bro ? w MI    Depression    Diabetes mellitus without complication (HCC)    Eczema    FRONT OF CHEST, MIDDLE OF BACK, FRONT OF LEGS   GERD (gastroesophageal reflux disease)    Glaucoma    sees Dr. Eliseo Gum at [facility].   Heart murmur    as child    Hyperlipidemia    Lightheadedness    Low back pain    Sleep apnea    wears cpap     Past Surgical History:  Procedure Laterality Date   CERVICAL SPINE  SURGERY  2014   per Dr. Coletta Memos   COLONOSCOPY  05/05/2021   per Dr. Myrtie Neither, adenomatous polyp,repeat in 5 yrs   LEFT HEART CATH AND CORONARY ANGIOGRAPHY N/A 10/14/2021   Procedure: LEFT HEART CATH AND CORONARY ANGIOGRAPHY;  Surgeon: Corky Crafts, MD;  Location: [facility] INVASIVE CV LAB;  Service: Cardiovascular;  Laterality: N/A;   LIPOMA EXCISION N/A 12/01/2015   Procedure: EXCISION OF LIPOMA OF BACK;  Surgeon: Harriette Bouillon, MD;  Location: [facility] OR;  Service: General;  Laterality: N/A;   POLYPECTOMY      Current Outpatient Medications  Medication Sig Dispense Refill   aspirin EC 81 MG tablet Take 81 mg by mouth daily. Swallow whole.     atorvastatin (LIPITOR) 10 MG tablet Take 1 tablet (10 mg total) by mouth daily. 90 tablet 3   buPROPion (WELLBUTRIN XL) 300 MG 24 hr tablet Take 1 tablet (300 mg total) by mouth daily. 90 tablet 3   celecoxib (CELEBREX) 200 MG capsule Take 1 capsule (200 mg total) by mouth 2 (two) times daily. 180 capsule 3   cholecalciferol (VITAMIN D3) 25 MCG (1000 UNIT) tablet Take 1,000 Units by mouth daily.     cyclobenzaprine (FLEXERIL) 10 MG tablet Take 1 tablet (  10 mg total) by mouth 3 (three) times daily as needed for muscle spasms. 90 tablet 5   dorzolamide-timolol (COSOPT) 22.3-6.8 MG/ML ophthalmic solution Place 1 drop into both eyes daily. 10 mL 0   DULoxetine (CYMBALTA) 30 MG capsule TAKE 1 CAPSULE BY MOUTH EVERY DAY 90 capsule 3   gabapentin (NEURONTIN) 100 MG capsule TAKE 1 CAPSULE BY MOUTH TWICE A DAY 60 capsule 3   latanoprost (XALATAN) 0.005 % ophthalmic solution Place 1 drop into both eyes at bedtime.     losartan-hydrochlorothiazide (HYZAAR) 50-12.5 MG tablet Take 1 tablet by mouth daily. 90 tablet 3   metFORMIN (GLUCOPHAGE) 500 MG tablet TAKE 1 TABLET BY MOUTH 2 TIMES DAILY WITH A MEAL. 180 tablet 3   metoprolol tartrate (LOPRESSOR) 25 MG tablet Take 1 tablet (25 mg total) by mouth 2 (two) times daily. 180 tablet 3   nitroGLYCERIN (NITROSTAT) 0.4  MG SL tablet Place 1 tablet (0.4 mg total) under the tongue every 5 (five) minutes as needed for chest pain. 25 tablet 6   omeprazole (PRILOSEC) 40 MG capsule TAKE 1 CAPSULE BY MOUTH TWICE A DAY 180 capsule 0   RHOPRESSA 0.02 % SOLN Place 1 drop into both eyes every morning.     Spacer/Aero-Holding Chambers (AEROCHAMBER MV) inhaler Use as instructed 1 each 1   No current facility-administered medications for this visit.    Allergies as of 04/14/2023 - Review Complete 04/14/2023  Allergen Reaction Noted   Citalopram hydrobromide Rash 10/27/2006   Crestor [rosuvastatin] Rash 03/09/2021   Other Rash 10/14/2021    Family History  Problem Relation Age of Onset   Cancer Father    Cancer Sister    Colon cancer Brother    Diabetes Brother    Diabetes Other    Colon polyps Neg Hx    Esophageal cancer Neg Hx    Rectal cancer Neg Hx    Stomach cancer Neg Hx     Social History   Socioeconomic History   Marital status: Married    Spouse name: Not on file   Number of children: Not on file   Years of education: Not on file   Highest education level: Not on file  Occupational History   Not on file  Tobacco Use   Smoking status: Never   Smokeless tobacco: Never  Vaping Use   Vaping status: Never Used  Substance and Sexual Activity   Alcohol use: No    Alcohol/week: 0.0 standard drinks of alcohol   Drug use: No   Sexual activity: Yes  Other Topics Concern   Not on file  Social History Narrative   Right handed   Lives with wife in one story home   Social Drivers of Health   Financial Resource Strain: Not on file  Food Insecurity: Not on file  Transportation Needs: Not on file  Physical Activity: Not on file  Stress: Not on file  Social Connections: Not on file  Intimate Partner Violence: Not on file    Review of Systems:    Constitutional: No weight loss, fever or chills Skin: No rash  Cardiovascular: No chest pain  Respiratory: No SOB  Gastrointestinal: See HPI and  otherwise negative Genitourinary: No dysuria Neurological: No headache, dizziness or syncope Musculoskeletal: No new muscle or joint pain Hematologic: No bleeding  Psychiatric: No history of depression or anxiety   Physical Exam:  Vital signs: BP 112/72   Pulse 90   Ht 6\' 4"  (1.93 m)   Wt 273 lb  2 oz (123.9 kg)   BMI 33.25 kg/m   Constitutional:   Pleasant overweight AA male appears to be in NAD, Well developed, Well nourished, alert and cooperative Head:  Normocephalic and atraumatic. Eyes:   PEERL, EOMI. No icterus. Conjunctiva pink. Ears:  Normal auditory acuity. Neck:  Supple Throat: Oral cavity and pharynx without inflammation, swelling or lesion.  Respiratory: Respirations even and unlabored. Lungs clear to auscultation bilaterally.   No wheezes, crackles, or rhonchi.  Cardiovascular: Normal S1, S2. No MRG. Regular rate and rhythm. No peripheral edema, cyanosis or pallor.  Gastrointestinal:  Soft, nondistended, nontender. No rebound or guarding. Normal bowel sounds. No appreciable masses or hepatomegaly. Rectal:  Not performed.  Msk:  Symmetrical without gross deformities. Without edema, no deformity or joint abnormality.  Neurologic:  Alert and  oriented x4;  grossly normal neurologically.  Skin:   Dry and intact without significant lesions or rashes. Psychiatric:  Demonstrates good judgement and reason without abnormal affect or behaviors.  RELEVANT LABS AND IMAGING: CBC    Component Value Date/Time   WBC 7.7 02/20/2023 1038   RBC 4.97 02/20/2023 1038   HGB 15.3 02/20/2023 1038   HGB 15.3 01/25/2023 1625   HCT 46.1 02/20/2023 1038   HCT 45.6 01/25/2023 1625   PLT 285.0 02/20/2023 1038   PLT 313 01/25/2023 1625   MCV 92.6 02/20/2023 1038   MCV 92 01/25/2023 1625   MCH 30.8 01/25/2023 1625   MCH 29.6 01/15/2020 1528   MCHC 33.2 02/20/2023 1038   RDW 14.4 02/20/2023 1038   RDW 13.0 01/25/2023 1625   LYMPHSABS 2.2 02/20/2023 1038   MONOABS 0.8 02/20/2023 1038    EOSABS 0.3 02/20/2023 1038   BASOSABS 0.0 02/20/2023 1038    CMP     Component Value Date/Time   NA 139 02/20/2023 1038   NA 141 01/25/2023 1625   K 4.2 02/20/2023 1038   CL 102 02/20/2023 1038   CO2 28 02/20/2023 1038   GLUCOSE 102 (H) 02/20/2023 1038   BUN 19 02/20/2023 1038   BUN 18 01/25/2023 1625   CREATININE 1.19 02/20/2023 1038   CALCIUM 9.5 02/20/2023 1038   PROT 7.0 02/20/2023 1038   ALBUMIN 4.4 02/20/2023 1038   AST 32 02/20/2023 1038   ALT 41 02/20/2023 1038   ALKPHOS 43 02/20/2023 1038   BILITOT 0.5 02/20/2023 1038   GFRNONAA >60 11/16/2022 1735   GFRAA >60 11/23/2015 1056    Assessment: 1.  Dysphagia: Seen by ENT and they ordered an MBS as well as esophagram scheduled 01/18/2023, patient has trouble both solids and liquids over the past year and a half, last EGD years ago was normal, no overt heartburn or reflux on Omeprazole 40 twice daily; consider esophageal dysmotility for stricture versus other  Plan: 1.  I will follow-up with the patient after results from MBS and esophagram.  If these indicate he needs an EGD would be done in the [facility] with Dr. Myrtie Neither.  Did go ahead and discussed an EGD today with the patient if needed.  Will see what the studies show first. 2.  Continue Omeprazole 40 mg twice daily 3.  Patient to follow in clinic per recommendations after imaging.  Hyacinth Meeker, PA-C [facility] 04/14/2023, 8:29 AM  Cc: Nelwyn Salisbury, MD

## 2023-04-17 NOTE — Progress Notes (Signed)
 ____________________________________________________________  Attending physician addendum:  Thank you for sending this case to me. I have reviewed the entire note and agree with the plan.  This sounds like transfer dysphagia, but we will see if EGD is needed depending on MBS results.  Amada Jupiter, MD  ____________________________________________________________

## 2023-04-18 ENCOUNTER — Ambulatory Visit (HOSPITAL_COMMUNITY)
Admission: RE | Admit: 2023-04-18 | Discharge: 2023-04-18 | Disposition: A | Discharge status: Home / Self Care | Source: Ambulatory Visit | Attending: *Deleted | Admitting: *Deleted

## 2023-04-18 ENCOUNTER — Ambulatory Visit (HOSPITAL_COMMUNITY)
Admission: RE | Admit: 2023-04-18 | Discharge: 2023-04-18 | Disposition: A | Discharge status: Home / Self Care | Source: Ambulatory Visit | Attending: Otolaryngology

## 2023-04-18 DIAGNOSIS — R1310 Dysphagia, unspecified: Secondary | ICD-10-CM

## 2023-04-18 DIAGNOSIS — R059 Cough, unspecified: Secondary | ICD-10-CM | POA: Diagnosis not present

## 2023-04-18 DIAGNOSIS — K449 Diaphragmatic hernia without obstruction or gangrene: Secondary | ICD-10-CM | POA: Insufficient documentation

## 2023-04-19 ENCOUNTER — Telehealth: Payer: Self-pay

## 2023-04-19 NOTE — Telephone Encounter (Signed)
 Left message on machine to call back

## 2023-04-19 NOTE — Telephone Encounter (Signed)
-----   Message from Unk Lightning sent at 04/19/2023  9:06 AM EDT ----- Regarding: FW: imaging Please let patient know I reviewed MBS and Esophagram and these both appeared fairly normal.  I think we should proceed with EGD for further evaluation given his symptoms.  This needs to be scheduled in the [facility] with Dr. Myrtie Neither.  Thanks-JLL ----- Message ----- From: Unk Lightning, PA Sent: 04/19/2023   7:00 AM EDT To: Unk Lightning, PA Subject: imaging                                        Look at [facility] and swallow- ?EGD

## 2023-04-19 NOTE — Telephone Encounter (Signed)
Patient returned call, requesting a call back. Please advise. °

## 2023-04-20 ENCOUNTER — Encounter: Payer: Self-pay | Admitting: Urology

## 2023-04-20 ENCOUNTER — Encounter: Payer: Self-pay | Admitting: Gastroenterology

## 2023-04-20 ENCOUNTER — Other Ambulatory Visit: Payer: Self-pay

## 2023-04-20 NOTE — Telephone Encounter (Signed)
 Patient returning phone call. Please advise, thank you.

## 2023-04-20 NOTE — Telephone Encounter (Signed)
 Left message on machine to call back

## 2023-04-20 NOTE — Telephone Encounter (Signed)
 PT returned call and has been scheduled for EGD 06/23/2023

## 2023-04-20 NOTE — Telephone Encounter (Signed)
 Pt has been set up for EGD and previsit.

## 2023-04-20 NOTE — Telephone Encounter (Signed)
 The pt has been advised of the results.  He has agreed to the EGD and transferred to the schedulers.

## 2023-05-06 ENCOUNTER — Ambulatory Visit
Admission: RE | Admit: 2023-05-06 | Discharge: 2023-05-06 | Disposition: A | Discharge status: Home / Self Care | Source: Ambulatory Visit | Attending: Urology | Admitting: Urology

## 2023-05-06 DIAGNOSIS — R972 Elevated prostate specific antigen [PSA]: Secondary | ICD-10-CM | POA: Diagnosis not present

## 2023-05-06 MED ORDER — GADOPICLENOL 0.5 MMOL/ML IV SOLN
10.0000 mL | Freq: Once | INTRAVENOUS | Status: AC | PRN
  Administered 2023-05-06: 10 mL via INTRAVENOUS

## 2023-05-17 ENCOUNTER — Other Ambulatory Visit: Payer: Self-pay | Admitting: Family Medicine

## 2023-05-18 HISTORY — PX: PROSTATE BIOPSY: SHX241

## 2023-05-18 NOTE — Telephone Encounter (Signed)
 Ordering and Authorizing Provider: Carrington Clack to fill?

## 2023-06-02 DIAGNOSIS — D075 Carcinoma in situ of prostate: Secondary | ICD-10-CM | POA: Diagnosis not present

## 2023-06-02 DIAGNOSIS — R972 Elevated prostate specific antigen [PSA]: Secondary | ICD-10-CM | POA: Diagnosis not present

## 2023-06-02 DIAGNOSIS — N4289 Other specified disorders of prostate: Secondary | ICD-10-CM | POA: Diagnosis not present

## 2023-06-09 ENCOUNTER — Ambulatory Visit (AMBULATORY_SURGERY_CENTER): Admitting: *Deleted

## 2023-06-09 VITALS — Ht 76.0 in | Wt 272.0 lb

## 2023-06-09 DIAGNOSIS — R1310 Dysphagia, unspecified: Secondary | ICD-10-CM

## 2023-06-09 NOTE — Progress Notes (Signed)
 Pre visit completed over telephone. Instructions sent through MyChart.  No egg or soy allergy known to patient  No issues known to pt with past sedation with any surgeries or procedures Patient denies ever being told they had issues or difficulty with intubation  No FH of Malignant Hyperthermia Pt is not on diet pills Pt is not on  home 02  Pt is not on blood thinners  Pt denies issues with constipation  No A fib or A flutter Have any cardiac testing pending--NO Pt instructed to use [email or web address].[email or web address] or GoodRx for a price reduction on prep

## 2023-06-13 ENCOUNTER — Encounter: Payer: Self-pay | Admitting: Gastroenterology

## 2023-06-23 ENCOUNTER — Encounter: Payer: Self-pay | Admitting: Gastroenterology

## 2023-06-23 ENCOUNTER — Ambulatory Visit: Admitting: Gastroenterology

## 2023-06-23 VITALS — BP 135/75 | HR 88 | Temp 99.0°F | Resp 18 | Ht 76.0 in | Wt 272.0 lb

## 2023-06-23 DIAGNOSIS — R1314 Dysphagia, pharyngoesophageal phase: Secondary | ICD-10-CM

## 2023-06-23 DIAGNOSIS — R1310 Dysphagia, unspecified: Secondary | ICD-10-CM | POA: Diagnosis not present

## 2023-06-23 MED ORDER — SODIUM CHLORIDE 0.9 % IV SOLN: 500.0000 mL | Freq: Once | INTRAVENOUS | Status: DC

## 2023-06-23 NOTE — Patient Instructions (Signed)
 Resume previous diet Continue present medications Return to ENT physician for consideration of further testing and treatment YOU HAD AN ENDOSCOPIC PROCEDURE TODAY AT THE [facility]:   Refer to the procedure report that was given to you for any specific questions about what was found during the examination.  If the procedure report does not answer your questions, please call your gastroenterologist to clarify.  If you requested that your care partner not be given the details of your procedure findings, then the procedure report has been included in a sealed envelope for you to review at your convenience later.  YOU SHOULD EXPECT: Some feelings of bloating in the abdomen. Passage of more gas than usual.  Walking can help get rid of the air that was put into your GI tract during the procedure and reduce the bloating. If you had a lower endoscopy (such as a colonoscopy or flexible sigmoidoscopy) you may notice spotting of blood in your stool or on the toilet paper. If you underwent a bowel prep for your procedure, you may not have a normal bowel movement for a few days.  Please Note:  You might notice some irritation and congestion in your nose or some drainage.  This is from the oxygen used during your procedure.  There is no need for concern and it should clear up in a day or so.  SYMPTOMS TO REPORT IMMEDIATELY:  Following upper endoscopy (EGD)  Vomiting of blood or coffee ground material  New chest pain or pain under the shoulder blades  Painful or persistently difficult swallowing  New shortness of breath  Fever of 100F or higher  Black, tarry-looking stools  For urgent or emergent issues, a gastroenterologist can be reached at any hour by calling [phone]. Do not use MyChart messaging for urgent concerns.   DIET:  We do recommend a small meal at first, but then you may proceed to your regular diet.  Drink plenty of fluids but you should avoid alcoholic beverages for 24  hours.  ACTIVITY:  You should plan to take it easy for the rest of today and you should NOT DRIVE or use heavy machinery until tomorrow (because of the sedation medicines used during the test).    FOLLOW UP: Our staff will call the number listed on your records the next business day following your procedure.  We will call around 7:15- 8:00 am to check on you and address any questions or concerns that you may have regarding the information given to you following your procedure. If we do not reach you, we will leave a message.     If any biopsies were taken you will be contacted by phone or by letter within the next 1-3 weeks.  Please call us  at [phone] if you have not heard about the biopsies in 3 weeks.   SIGNATURES/CONFIDENTIALITY: You and/or your care partner have signed paperwork which will be entered into your electronic medical record.  These signatures attest to the fact that that the information above on your After Visit Summary has been reviewed and is understood.  Full responsibility of the confidentiality of this discharge information lies with you and/or your care-partner.

## 2023-06-23 NOTE — Progress Notes (Signed)
 Pt's states no medical or surgical changes since previsit or office visit.

## 2023-06-23 NOTE — Progress Notes (Signed)
 History and Physical:  This patient presents for endoscopic testing for: Encounter Diagnosis  Name Primary?   Dysphagia, unspecified type Yes    68 yo man referred by ENT with dysphagia (primarily described like pharyngoesophageal), clinical details in 04/14/23 office consult note. Barium esophagram (see below) with possible dysmotility.  Hx ACDF ENT consult on file from 04/10/23 suggested glottic insufficiency Patient is otherwise without complaints or active issues today.   Past Medical History: Past Medical History:  Diagnosis Date   Anxiety    Bronchial pneumonia    back in January   Carpal tunnel syndrome, bilateral    Coronary artery calcification 01/25/2023   Cath 08/2021: min nonobstructive coronary plaque  Severe R subclavian tortuosity - Need R fem artery approach if cath needed in futureFHx of CAD  One Bro w MI x 2; Another Bro ? w MI    Depression    Diabetes mellitus without complication (HCC)    Eczema    FRONT OF CHEST, MIDDLE OF BACK, FRONT OF LEGS   GERD (gastroesophageal reflux disease)    Glaucoma    se[facility] Dr. Patton Borer at [facility].   Heart murmur    as child    Hyperlipidemia    Hypertension    Lightheadedness    Low back pain    Sleep apnea    wears cpap      Past Surgical History: Past Surgical History:  Procedure Laterality Date   CERVICAL SPINE SURGERY  2014   per Dr. Audie Bleacher   COLONOSCOPY  05/05/2021   per Dr. Dominic Friendly, adenomatous polyp,repeat in 5 yrs   LEFT HEART CATH AND CORONARY ANGIOGRAPHY N/A 10/14/2021   Procedure: LEFT HEART CATH AND CORONARY ANGIOGRAPHY;  Surgeon: Lucendia Rusk, MD;  Location: [facility] INVASIVE CV LAB;  Service: Cardiovascular;  Laterality: N/A;   LIPOMA EXCISION N/A 12/01/2015   Procedure: EXCISION OF LIPOMA OF BACK;  Surgeon: Sim Dryer, MD;  Location: [facility] OR;  Service: General;  Laterality: N/A;   POLYPECTOMY     PROSTATE BIOPSY  05/2023   3 polyps removed    Allergies: Allergies  Allergen  Reactions   Citalopram Hydrobromide Rash   Crestor  [Rosuvastatin ] Rash   Other Rash    Coban    Outpatient Meds: Current Outpatient Medications  Medication Sig Dispense Refill   aspirin  EC 81 MG tablet Take 81 mg by mouth daily. Swallow whole.     atorvastatin  (LIPITOR) 10 MG tablet Take 1 tablet (10 mg total) by mouth daily. 90 tablet 3   buPROPion  (WELLBUTRIN  XL) 300 MG 24 hr tablet Take 1 tablet (300 mg total) by mouth daily. 90 tablet 3   celecoxib  (CELEBREX ) 200 MG capsule Take 1 capsule (200 mg total) by mouth 2 (two) times daily. 180 capsule 3   cholecalciferol (VITAMIN D3) 25 MCG (1000 UNIT) tablet Take 1,000 Units by mouth daily.     dorzolamide -timolol  (COSOPT ) 22.3-6.8 MG/ML ophthalmic solution Place 1 drop into both eyes daily. 10 mL 0   DULoxetine  (CYMBALTA ) 30 MG capsule TAKE 1 CAPSULE BY MOUTH EVERY DAY 90 capsule 3   gabapentin  (NEURONTIN ) 100 MG capsule TAKE 1 CAPSULE BY MOUTH TWICE A DAY 60 capsule 3   latanoprost (XALATAN) 0.005 % ophthalmic solution Place 1 drop into both eyes at bedtime.     losartan -hydrochlorothiazide (HYZAAR) 50-12.5 MG tablet Take 1 tablet by mouth daily. 90 tablet 3   metFORMIN  (GLUCOPHAGE ) 500 MG tablet TAKE 1 TABLET BY MOUTH 2 TIMES DAILY WITH A MEAL.  180 tablet 3   metoprolol  tartrate (LOPRESSOR ) 25 MG tablet TAKE 0.5 TABLETS BY MOUTH 2 TIMES DAILY. 90 tablet 3   omeprazole  (PRILOSEC) 40 MG capsule TAKE 1 CAPSULE BY MOUTH TWICE A DAY 180 capsule 0   RHOPRESSA 0.02 % SOLN Place 1 drop into both eyes every morning.     cyclobenzaprine  (FLEXERIL ) 10 MG tablet Take 1 tablet (10 mg total) by mouth 3 (three) times daily as needed for muscle spasms. 90 tablet 5   nitroGLYCERIN  (NITROSTAT ) 0.4 MG SL tablet Place 1 tablet (0.4 mg total) under the tongue every 5 (five) minutes as needed for chest pain. (Patient not taking: Reported on 06/23/2023) 25 tablet 6   Current Facility-Administered Medications  Medication Dose Route Frequency Provider Last Rate  Last Admin   0.9 %  sodium chloride  infusion  500 mL Intravenous Once Danis, Yoselin Amerman L III, MD          ___________________________________________________________________ Objective   Exam:  BP 132/75   Pulse 96   Temp 99 F (37.2 C)   Ht 6\' 4"  (1.93 m)   Wt 272 lb (123.4 kg)   SpO2 97%   BMI 33.11 kg/m   CV: regular , S1/S2 Resp: clear to auscultation bilaterally, normal RR and effort noted GI: soft, no tenderness, with active bowel sounds.   Assessment: Encounter Diagnosis  Name Primary?   Dysphagia, unspecified type Yes     Plan: EGD with possible dilation  The benefits and risks of the planned procedure(s) were described in detail with the patient or (when appropriate) their health care proxy.  Risks were outlined as including, but not limited to, bleeding, infection, perforation, adverse medication reaction leading to cardiac or pulmonary decompensation, pancreatitis (if ERCP).  The limitation of incomplete mucosal visualization was also discussed.  No guarantees or warranties were given.  The patient is appropriate for an endoscopic procedure in the ambulatory setting.   - Lorella Roles, MD

## 2023-06-23 NOTE — Op Note (Signed)
 [facility] Patient Name: Curtis Duran Procedure Date: 06/23/2023 11:39 AM MRN: [id number] Endoscopist: Ace Abu L. Dominic Friendly , MD, [id number] Age: 68 Referring MD:  Date of Birth: 02/12/1955 Gender: Male Account #: [id number] Procedure:                Upper GI endoscopy Indications:              Pharyngeal/esophageal phase dysphagia                           see recent office consult note for clinical details                           barium esophagram with diminished primary                            peristaltic wave on one swallow, small hiatal                            hernia, no aspiration, no GERD seen, 13mm tablet                            passed easily Medicines:                Monitored Anesthesia Care Procedure:                Pre-Anesthesia Assessment:                           - Prior to the procedure, a History and Physical                            was performed, and patient medications and                            allergies were reviewed. The patient's tolerance of                            previous anesthesia was also reviewed. The risks                            and benefits of the procedure and the sedation                            options and risks were discussed with the patient.                            All questions were answered, and informed consent                            was obtained. Prior Anticoagulants: The patient has                            taken no anticoagulant or antiplatelet agents. ASA  Grade Assessment: III - A patient with severe                            systemic disease. After reviewing the risks and                            benefits, the patient was deemed in satisfactory                            condition to undergo the procedure.                           After obtaining informed consent, the endoscope was                            passed under direct vision. Throughout the                             procedure, the patient's blood pressure, pulse, and                            oxygen saturations were monitored continuously. The                            Olympus Scope [id number] was introduced through the                            mouth, and advanced to the second part of duodenum.                            The upper GI endoscopy was accomplished without                            difficulty. The patient tolerated the procedure                            well. Scope In: Scope Out: Findings:                 The larynx was normal.                           There is no endoscopic evidence of esophagitis,                            hiatal hernia, stricture, areas of dilation, signs                            of external compression or lower esophageal                            sphincter tone abnormalities in the entire                            esophagus. Scope passed easily through entire  esophagus, including both UES and LES.                           The stomach was normal.                           The cardia and gastric fundus were normal on                            retroflexion.                           The examined duodenum was normal. Complications:            No immediate complications. Estimated Blood Loss:     Estimated blood loss: none. Impression:               - Normal stomach.                           - Normal examined duodenum.                           - No specimens collected.                           If there is a hiatal hernia as suggested on                            esophagram, it must be small and sliding since it                            was not identifed on this exam. Recommendation:           - Patient has a contact number available for                            emergencies. The signs and symptoms of potential                            delayed complications were discussed with the                             patient. Return to normal activities tomorrow.                            Written discharge instructions were provided to the                            patient.                           - Resume previous diet.                           - Continue present medications.                           -  Return to ENT physician for consideration of                            further testing and treatment. Crissy Mccreadie L. Dominic Friendly, MD 06/23/2023 12:09:20 PM This report has been signed electronically.

## 2023-06-23 NOTE — Progress Notes (Signed)
 Vss nad trans to pacu

## 2023-06-26 ENCOUNTER — Telehealth: Payer: Self-pay | Admitting: Lactation Services

## 2023-06-26 NOTE — Telephone Encounter (Signed)
  Follow up Call-     06/23/2023   10:56 AM 05/05/2021    9:54 AM  Call back number  Post procedure Call Back phone  # [phone]  Permission to leave phone message Yes Yes     Patient questions:  Do you have a fever, pain , or abdominal swelling? No. Pain Score  0 *  Have you tolerated food without any problems? Yes.    Have you been able to return to your normal activities? Yes.    Do you have any questions about your discharge instructions: Diet   No. Medications  No. Follow up visit  No.  Do you have questions or concerns about your Care? No.  Actions: * If pain score is 4 or above: No action needed, pain <4.

## 2023-07-05 ENCOUNTER — Other Ambulatory Visit: Payer: Self-pay | Admitting: Family Medicine

## 2023-07-12 ENCOUNTER — Ambulatory Visit (INDEPENDENT_AMBULATORY_CARE_PROVIDER_SITE_OTHER): Admitting: Otolaryngology

## 2023-07-12 ENCOUNTER — Encounter (INDEPENDENT_AMBULATORY_CARE_PROVIDER_SITE_OTHER): Payer: Self-pay | Admitting: Otolaryngology

## 2023-07-12 VITALS — BP 128/85 | HR 84

## 2023-07-12 DIAGNOSIS — K219 Gastro-esophageal reflux disease without esophagitis: Secondary | ICD-10-CM | POA: Diagnosis not present

## 2023-07-12 DIAGNOSIS — R0981 Nasal congestion: Secondary | ICD-10-CM | POA: Diagnosis not present

## 2023-07-12 DIAGNOSIS — J383 Other diseases of vocal cords: Secondary | ICD-10-CM

## 2023-07-12 DIAGNOSIS — R1310 Dysphagia, unspecified: Secondary | ICD-10-CM | POA: Diagnosis not present

## 2023-07-12 DIAGNOSIS — R0982 Postnasal drip: Secondary | ICD-10-CM

## 2023-07-12 DIAGNOSIS — T17320A Food in larynx causing asphyxiation, initial encounter: Secondary | ICD-10-CM

## 2023-07-12 MED ORDER — LEVOCETIRIZINE DIHYDROCHLORIDE 5 MG PO TABS
5.0000 mg | ORAL_TABLET | Freq: Every evening | ORAL | 3 refills | Status: AC
Start: 2023-07-12 — End: ?

## 2023-07-12 MED ORDER — AZELASTINE HCL 0.1 % NA SOLN: 2.0000 | Freq: Two times a day (BID) | NASAL | 12 refills | Status: AC

## 2023-07-12 NOTE — Patient Instructions (Signed)

## 2023-07-12 NOTE — Progress Notes (Signed)
 ENT Progress Note:   Update 07/12/2023  Discussed the use of AI scribe software for clinical note transcription with the patient, who gave verbal consent to proceed.  History of Present Illness  Discussed the use of AI scribe software for clinical note transcription with the patient, who gave verbal consent to proceed.  History of Present Illness Curtis Duran is a 68 year old male here to f/u after swallow study.  He underwent a swallow study which revealed a very small hiatal hernia and normal oropharyngeal swallow. He has been experiencing increased coughing since yesterday, which he associates with his reflux condition. He is currently taking omeprazole  for reflux management.  His swallowing has been 'pretty good' despite the recent onset of coughing. He is concerned about dietary measures to prevent aggravation of his condition.  He uses eye drops for glaucoma and suspects they might be related to his symptoms. He also experiences postnasal drainage, which he sometimes attributes to his eye drops. Gets allergy sx with pollen in the air.    Records Reviewed:  Initial Evaluation  Reason for Consult: dysphagia  choking on foods   HPI: Discussed the use of AI scribe software for clinical note transcription with the patient, who gave verbal consent to proceed.  History of Present Illness Curtis Duran is a 68 year old male with hx of GERD on Prilosec, T2DM, who presents with difficulty swallowing.  He experiences difficulty swallowing both solids and liquids, leading to coughing and choking. This issue occurs daily and has persisted for two to three years. He has not altered his diet due to this problem and has not undergone a swallow study previously. No weight loss, no aspiration PNA hx. No significant voice changes. No hx of stroke, heart attack, or critical illness prior to the onset of his swallowing difficulties.  He is currently taking a medication for heartburn or  reflux, on Prilosec 40 mg taken every morning. He has OSA, but does not use a CPAP machine for sleep apnea as he finds it difficult to tolerate and is not compliant with its use. A previous home sleep test was problematic due to the equipment coming loose, resulting in an apnea-hypopnea index (AHI) of 15.1, indicating moderate sleep apnea.  He has a history of bronchial pneumonia 2 yrs ago. No shortness of breath at rest. He has a history of undergoing a cardiac catheterization in 2023, which did not result in stenting due to the absence of significant blockage.   PCP office visit 02/20/23 Well exam. We discussed diet and exercise. Get fasting labs. He has some dysphagia, so we will refer him to ENT to evaluate. He also has some eczema on the face and scalp, so he will try applying OTC cortisone cream    Past Medical History:  Diagnosis Date   Anxiety    Bronchial pneumonia    back in January   Carpal tunnel syndrome, bilateral    Coronary artery calcification 01/25/2023   Cath 08/2021: min nonobstructive coronary plaque  Severe R subclavian tortuosity - Need R fem artery approach if cath needed in futureFHx of CAD  One Bro w MI x 2; Another Bro ? w MI    Depression    Diabetes mellitus without complication (HCC)    Eczema    FRONT OF CHEST, MIDDLE OF BACK, FRONT OF LEGS   GERD (gastroesophageal reflux disease)    Glaucoma    sees Dr. Lorella at [facility].   Heart murmur  as child    Hyperlipidemia    Hypertension    Lightheadedness    Low back pain    Sleep apnea    wears cpap     Past Surgical History:  Procedure Laterality Date   CERVICAL SPINE SURGERY  2014   per Dr. Rockey [place]   COLONOSCOPY  05/05/2021   per Dr. Legrand, adenomatous polyp,repeat in 5 yrs   LEFT HEART CATH AND CORONARY ANGIOGRAPHY N/A 10/14/2021   Procedure: LEFT HEART CATH AND CORONARY ANGIOGRAPHY;  Surgeon: Dann Candyce RAMAN, MD;  Location: [facility] INVASIVE CV LAB;  Service: Cardiovascular;   Laterality: N/A;   LIPOMA EXCISION N/A 12/01/2015   Procedure: EXCISION OF LIPOMA OF BACK;  Surgeon: Debby Shipper, MD;  Location: [facility] OR;  Service: General;  Laterality: N/A;   POLYPECTOMY     PROSTATE BIOPSY  05/2023   3 polyps removed    Family History  Problem Relation Age of Onset   Cancer Father    Cancer Sister    Colon cancer Brother    Diabetes Brother    Diabetes Other    Colon polyps Neg Hx    Esophageal cancer Neg Hx    Rectal cancer Neg Hx    Stomach cancer Neg Hx     Social History:  reports that he has never smoked. He has never used smokeless tobacco. He reports that he does not drink alcohol and does not use drugs.  Allergies:  Allergies  Allergen Reactions   Citalopram Hydrobromide Rash   Crestor  [Rosuvastatin ] Rash   Other Rash    Coban    Medications: I have reviewed the patient's current medications.  The PMH, PSH, Medications, Allergies, and SH were reviewed and updated.  ROS: Constitutional: Negative for fever, weight loss and weight gain. Cardiovascular: Negative for chest pain and dyspnea on exertion. Respiratory: Is not experiencing shortness of breath at rest. Gastrointestinal: Negative for nausea and vomiting. Neurological: Negative for headaches. Psychiatric: The patient is not nervous/anxious  Blood pressure 128/85, pulse 84, SpO2 97%. There is no height or weight on file to calculate BMI.  PHYSICAL EXAM:  Exam: General: Well-developed, well-nourished Communication and Voice: raspy Respiratory Respiratory effort: Equal inspiration and expiration without stridor Cardiovascular Peripheral Vascular: Warm extremities with equal color/perfusion Eyes: No nystagmus with equal extraocular motion bilaterally Neuro/Psych/Balance: Patient oriented to person, place, and time; Appropriate mood and affect; Gait is intact with no imbalance; Cranial nerves I-XII are intact Head and Face Inspection: Normocephalic and atraumatic without mass or  lesion Palpation: Facial skeleton intact without bony stepoffs Salivary Glands: No mass or tenderness Facial Strength: Facial motility symmetric and full bilaterally ENT Pinna: External ear intact and fully developed External canal: Canal is patent with intact skin Tympanic Membrane: Clear and mobile External Nose: No scar or anatomic deformity  Lips, Teeth, and gums: Mucosa and teeth intact and viable TMJ: No pain to palpation with full mobility Oral cavity/oropharynx: No erythema or exudate, no lesions present Neck Neck and Trachea: Midline trachea without mass or lesion Thyroid : No mass or nodularity Lymphatics: No lymphadenopathy  Studies Reviewed: CXR 11/16/22 The cardiomediastinal contours are normal. Chronic elevation of right hemidiaphragm. Pulmonary vasculature is normal. No consolidation, pleural effusion, or pneumothorax. No acute osseous abnormalities are seen.   IMPRESSION: No active cardiopulmonary disease  HST 2019  AHI of 15.1   MBS esophagram 04/18/23 MBS  Clinical Impression: Clinical Impression: Normal oropharyngeal swallow ability noted without aspiration or penetration of any consistency tested.  Pt swallow is  strong without significant retention.  Pharyngeal swallow triggered at laryngeal surface of epiglottis with cracker and liquids and vallecula with pudding.  Pharyngeal clearance and airway closure were [facility].  Pt did not cough or complain of retention on MBS.  He did appear with prominent PES that did not impair barium flow.   No oropharyngeal dysphagia to account for his dysphagia symptoms. He did appear with mild palatal deviation to the left upon phonation and minimal right labial loss of air with seal.  Esophagram 04/18/23 FINDINGS: Swallowing: Unremarkable   Pharynx: Unremarkable.   Esophagus: Normal appearance.   Esophageal motility: Disruption of primary peristaltic wave on a single swallow out of 4, considered within normal limits.   Hiatal  Hernia: Small type 1 hiatal hernia.   Gastroesophageal reflux: No evidence of spontaneous or provoked reflux.   Ingested 13mm barium tablet: Passed normally   Other: None.   IMPRESSION: 1. Small type 1 hiatal hernia.  Otherwise unremarkable  Assessment/Plan: Encounter Diagnoses  Name Primary?   Chronic GERD Yes   Choking due to food (regurgitated), initial encounter    Age-related vocal fold atrophy    Glottic insufficiency      Assessment and Plan Assessment & Plan Dysphagia Chronic dysphagia with coughing and choking on both foods and liquids. Hx of GERD on Prilosec. Laryngoscopy showed thin vocal cords, suspect glottic insufficiency, his UES opened with swallowing during flexible scope, and there was no pooling of secretions in pyriforms. Differential includes oropharyngeal vs esophageal dysphagia, possible aspiration. - Order swallow study, MBS and esophagram - Provide information on reflux prevention and Reflux Gourmet supplement  - Consider gastroenterology referral for upper endoscopy if swallow study is normal. - Discuss swallow therapy and vocal cord injection if aspiration confirmed.  VF atrophy and glottic insufficiency suspected on scope exam - will consider strobe in the future  - will consider voice therapy vs VF injection augmentation in the future   GERD LPR - Continue Prilosec 40 mg 30 min prior to breakfast  -  Reflux Gourmet after meals - diet and lifestyle changes to minimize GERD - Refer to [employer] blog for dietary and lifestyle modifications/reflux cook book  Obstructive Sleep Apnea (OSA) Moderate OSA with AHI of 15.1 on HST 2019. Non-compliance with CPAP. Discussed surgical alternatives, specifically Inspire. Requires retesting due to outdated sleep study. - Provide brochure on surgical alternatives for sleep apnea. - Discuss potential retesting for sleep apnea to confirm current AHI. BMI today was 32.   Follow-up Follow-up needed to  review swallow study results and discuss management. - Schedule follow-up appointment in 3 months to review swallow study results.   Update 07/12/2023  Assessment & Plan  Dysphagia  MBS with normal OP swallow, esophagram with small hiatal hernia.  - swallowing good currently  - monitor for now  GERD LPR Reflux disease potentially contributing to recent cough. Managed with omeprazole .  - Recommend Reflux Gourmet to manage reflux symptoms.  Chronic nasal congestion and Post-nasal drip - Prescribed Astelin nasal spray for postnasal drainage. Can't have Flonase  2/2 glaucoma hx  - Prescribed Xyzal for allergy management.    Elena Larry, MD Otolaryngology [facility] Phone: [phone] Fax: [phone]    07/12/2023, 2:08 PM

## 2023-07-14 ENCOUNTER — Ambulatory Visit: Payer: BC Managed Care – PPO | Attending: Internal Medicine | Admitting: Internal Medicine

## 2023-07-14 VITALS — Ht 76.0 in | Wt 268.0 lb

## 2023-07-14 DIAGNOSIS — E119 Type 2 diabetes mellitus without complications: Secondary | ICD-10-CM | POA: Diagnosis not present

## 2023-07-14 DIAGNOSIS — G4733 Obstructive sleep apnea (adult) (pediatric): Secondary | ICD-10-CM | POA: Diagnosis not present

## 2023-07-14 DIAGNOSIS — E782 Mixed hyperlipidemia: Secondary | ICD-10-CM | POA: Diagnosis not present

## 2023-07-14 DIAGNOSIS — I2510 Atherosclerotic heart disease of native coronary artery without angina pectoris: Secondary | ICD-10-CM

## 2023-07-14 NOTE — Progress Notes (Signed)
 Cardiology Office Note:  .    Date:  07/14/2023  ID:  Curtis Duran, DOB 1955/09/24, MRN [id number] PCP: Curtis Duran LABOR, MD  [facility] Providers Cardiologist:  Curtis LABOR Leavens, MD Cardiology APP:  Curtis Duran     CC: Transition to new cardiologist  History of Present Illness: .    Curtis Duran is a 68 y.o. male with coronary artery disease who presents to establish care. He was referred by Dr. Dann for continued cardiovascular care.  He has a history of coronary artery disease with minimal non-obstructive coronary artery disease. In 2023, he underwent a heart catheterization, which revealed minor plaque buildup and a tortuous subclavian artery. He is currently asymptomatic with no angina or fatigue.  His hypertension is well-managed with losartan  and hydrochlorothiazide. He has hyperlipidemia with slightly elevated cholesterol levels noted in recent labs from spring 2025, and he is taking atorvastatin  10 mg.  He has diabetes with a history of elevated blood sugars but is making progress in managing his levels. He is overweight and engages in regular physical activity, including bicycling for an hour daily. However, he experiences shortness of breath with exertion, such as when cutting grass, and has arthritis in his knees, which limits his ability to walk for exercise.  He was diagnosed with obstructive sleep apnea but discontinued CPAP use due to discomfort. He has not been using any treatment for sleep apnea since then.  He was recently informed by an ENT doctor of a small hiatal hernia, which he was previously unaware of.  Discussed the use of AI scribe software for clinical note transcription with the patient, who gave verbal consent to proceed.   Relevant histories: .  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   Studies  Reviewed: .     Cardiac Studies & Procedures   ______________________________________________________________________________________________ CARDIAC CATHETERIZATION  CARDIAC CATHETERIZATION 10/14/2021  Conclusion   The left ventricular systolic function is normal.   LV end diastolic pressure is mildly elevated.   The left ventricular ejection fraction is 55-65% by visual estimate.   There is no aortic valve stenosis.   Severe tortuosity of the right subclavian.  Would use right femoral approach if cath was needed in the future.  Minimal, nonobstructive coronary plaque noted.  No etiology of his symptoms identified.  Continue with aggressive preventive therapy and risk factor modification.  Gradually increasing exercise should also be helpful.  Severe right subclavian tortuosity which made torquing catheters difficult.  If cardiac cath was needed in the future, would not use right femoral approach.  Findings Coronary Findings Diagnostic  Dominance: Left  Left Anterior Descending The vessel exhibits minimal luminal irregularities.  Right Coronary Artery Vessel is small. The vessel exhibits minimal luminal irregularities.  Intervention  No interventions have been documented.     ECHOCARDIOGRAM  ECHOCARDIOGRAM COMPLETE 11/24/2021  Narrative ECHOCARDIOGRAM REPORT    Patient Name:   Curtis Duran Date of Exam: 11/24/2021 Medical Rec #:  [id number]       Height:       76.0 in Accession #:    [id number]      Weight:       274.4 lb Date of Birth:  09-11-1955        BSA:          2.534 m Patient Age:  66 years        BP:           142/72 mmHg Patient Gender: M               HR:           95 bpm. Exam Location:  [address]  Procedure: 2D Echo, Cardiac Doppler, Color Doppler and Intracardiac Opacification Agent  Indications:    R07.9 Chest pain  History:        Patient has no prior history of Echocardiogram examinations. Signs/Symptoms:Murmur; Risk Factors:Diabetes,  Dyslipidemia and Sleep Apnea. COVID-19.  Sonographer:    Curtis Duran Referring Phys: [phone] Curtis Duran   Sonographer Comments: Technically difficult study due to poor echo windows, suboptimal apical window and suboptimal subcostal window. Image acquisition challenging due to patient body habitus. IMPRESSIONS   1. Left ventricular ejection fraction, by estimation, is 60 to 65%. The left ventricle has normal function. The left ventricle has no regional wall motion abnormalities. There is mild left ventricular hypertrophy of the basal-septal segment. Left ventricular diastolic parameters are consistent with Grade I diastolic dysfunction (impaired relaxation). 2. Right ventricular systolic function is normal. The right ventricular size is normal. Tricuspid regurgitation signal is inadequate for assessing PA pressure. 3. The mitral valve is normal in structure. Trivial mitral valve regurgitation. No evidence of mitral stenosis. 4. The aortic valve is tricuspid. Aortic valve regurgitation is not visualized. No aortic stenosis is present.  Comparison(s): No prior Echocardiogram.  FINDINGS Left Ventricle: Left ventricular ejection fraction, by estimation, is 60 to 65%. The left ventricle has normal function. The left ventricle has no regional wall motion abnormalities. Definity  contrast agent was given IV to delineate the left ventricular endocardial borders. The left ventricular internal cavity size was normal in size. There is mild left ventricular hypertrophy of the basal-septal segment. Left ventricular diastolic parameters are consistent with Grade I diastolic dysfunction (impaired relaxation).  Right Ventricle: The right ventricular size is normal. Right ventricular systolic function is normal. Tricuspid regurgitation signal is inadequate for assessing PA pressure. The tricuspid regurgitant velocity is 2.79 m/s, and with an assumed right atrial pressure of 3 mmHg, the estimated right  ventricular systolic pressure is 34.1 mmHg.  Left Atrium: Left atrial size was normal in size.  Right Atrium: Right atrial size was normal in size.  Pericardium: There is no evidence of pericardial effusion.  Mitral Valve: The mitral valve is normal in structure. Trivial mitral valve regurgitation. No evidence of mitral valve stenosis.  Tricuspid Valve: The tricuspid valve is normal in structure. Tricuspid valve regurgitation is trivial. No evidence of tricuspid stenosis.  Aortic Valve: The aortic valve is tricuspid. Aortic valve regurgitation is not visualized. No aortic stenosis is present.  Pulmonic Valve: The pulmonic valve was normal in structure. Pulmonic valve regurgitation is not visualized. No evidence of pulmonic stenosis.  Aorta: The aortic root is normal in size and structure.  Venous: The inferior vena cava was not well visualized.  IAS/Shunts: The interatrial septum was not well visualized.   LEFT VENTRICLE PLAX 2D LVIDd:         4.30 cm   Diastology LVIDs:         2.90 cm   LV e' medial:    7.20 cm/s LV PW:         0.90 cm   LV E/e' medial:  10.0 LV IVS:        1.20 cm   LV e' lateral:  12.80 cm/s LVOT diam:     2.30 cm   LV E/e' lateral: 5.6 LVOT Area:     4.15 cm   RIGHT VENTRICLE RV Basal diam:  2.70 cm RV S prime:     10.90 cm/s TAPSE (M-mode): 1.8 cm RVSP:           34.1 mmHg  LEFT ATRIUM             Index        RIGHT ATRIUM           Index LA diam:        4.30 cm 1.70 cm/m   RA Pressure: 3.00 mmHg LA Vol (A2C):   26.9 ml 10.62 ml/m  RA Area:     10.90 cm LA Vol (A4C):   21.3 ml 8.41 ml/m   RA Volume:   21.30 ml  8.41 ml/m LA Biplane Vol: 24.3 ml 9.59 ml/m  AORTA Ao Root diam: 3.10 cm Ao Asc diam:  3.00 cm  MITRAL VALVE               TRICUSPID VALVE TR Peak grad:   31.1 mmHg MV Decel Time: 148 msec    TR Vmax:        279.00 cm/s MV E velocity: 72.10 cm/s  Estimated RAP:  3.00 mmHg MV A velocity: 89.70 cm/s  RVSP:           34.1  mmHg MV E/A ratio:  0.80 SHUNTS Systemic Diam: 2.30 cm  Redell Shallow MD Electronically signed by Redell Shallow MD Signature Date/Time: 11/24/2021/1:11:25 PM    Final          ______________________________________________________________________________________________        Physical Exam:    BP 128/80, HR 82.  VS:  Ht 6' 4 (1.93 m)   Wt 268 lb (121.6 kg)   BMI 32.62 kg/m    Wt Readings from Last 3 Encounters:  07/14/23 268 lb (121.6 kg)  06/23/23 272 lb (123.4 kg)  06/09/23 272 lb (123.4 kg)    Gen: no distress, obese Neck: No JVD,  Cardiac: No Rubs or Gallops, no Murmur, RRR +2 radial pulses Respiratory: Clear to auscultation bilaterally, normal effort, normal  respiratory rate GI: Soft, nontender, non-distended  MS: No  edema;  moves all extremities Integument: Skin feels warm Neuro:  At time of evaluation, alert and oriented to person/place/time/situation  Psych: Normal affect, patient feels ok   ASSESSMENT AND PLAN: .    Coronary artery disease Minimal non-obstructive coronary artery disease identified on heart catheterization in 2023. Tortuous subclavian artery necessitates femoral access for future catheterizations. Currently asymptomatic with no angina. - Continue aspirin  81 mg daily - Continue atorvastatin  10 mg daily - Continue metoprolol  unless experiencing angina or fatigue - Continue nitroglycerin  as needed  Hypertension Blood pressure is well-controlled on current regimen. - Continue losartan  - Continue hydrochlorothiazide  Hyperlipidemia Cholesterol levels slightly elevated as per recent labs. Discussed potential for medication adjustment but no changes made at this time.  Type 2 diabetes mellitus Blood sugars elevated but progress is being made. Discussed potential benefits of GLP-1 medication for weight loss and diabetes management, but he is not interested in starting at this time.  Obstructive sleep apnea Previously diagnosed  with obstructive sleep apnea but discontinued CPAP use due to discomfort. Discussed potential benefits of GLP-1 medication for sleep apnea management, but he is not interested in starting at this time (AHI is 15+)  Overweight Reports feeling overweight and engages  in regular bicycling for exercise. Discussed importance of strength training and cardiovascular exercise. He is not currently interested in GLP-1 medication for weight loss. - Encourage continuation of bicycling for exercise - Encourage incorporation of strength training exercises  Hiatal hernia Recently diagnosed with a small hiatal hernia by an ENT. Advised that it can cause reflux and atypical chest pain, which should be discussed with primary care or ENT if symptoms arise.  Knee arthritis Reports arthritis in the knees, which limits certain physical activities. Discussed importance of finding suitable exercises that do not exacerbate knee pain. - Encourage finding alternative exercises that are knee-friendly  Curtis Leavens, MD FASE [facility] Cardiologist [facility]  [address] [place], [address] [place], [place] [address] [phone]  10:57 AM

## 2023-07-14 NOTE — Patient Instructions (Signed)
 Medication Instructions:  Your physician recommends that you continue on your current medications as directed. Please refer to the Current Medication list given to you today.  *If you need a refill on your cardiac medications before your next appointment, please call your pharmacy*  Lab Work: NONE  If you have labs (blood work) drawn today and your tests are completely normal, you will receive your results only by: MyChart Message (if you have MyChart) OR A paper copy in the mail If you have any lab test that is abnormal or we need to change your treatment, we will call you to review the results.  Testing/Procedures: NONE  Follow-Up: At [facility], you and your health needs are our priority.  As part of our continuing mission to provide you with exceptional heart care, our providers are all part of one team.  This team includes your primary Cardiologist (physician) and Advanced Practice Providers or APPs (Physician Assistants and Nurse Practitioners) who all work together to provide you with the care you need, when you need it.  Your next appointment:   1 year(s)  Provider:   Glendia Ferrier, PA-C

## 2023-07-18 DIAGNOSIS — H2513 Age-related nuclear cataract, bilateral: Secondary | ICD-10-CM | POA: Diagnosis not present

## 2023-07-18 DIAGNOSIS — H401131 Primary open-angle glaucoma, bilateral, mild stage: Secondary | ICD-10-CM | POA: Diagnosis not present

## 2023-07-23 ENCOUNTER — Other Ambulatory Visit: Payer: Self-pay | Admitting: Family Medicine

## 2023-07-23 DIAGNOSIS — E119 Type 2 diabetes mellitus without complications: Secondary | ICD-10-CM

## 2023-07-31 ENCOUNTER — Other Ambulatory Visit: Payer: Self-pay | Admitting: Family Medicine

## 2023-10-02 ENCOUNTER — Other Ambulatory Visit: Payer: Self-pay | Admitting: Family Medicine

## 2023-10-03 DIAGNOSIS — R972 Elevated prostate specific antigen [PSA]: Secondary | ICD-10-CM | POA: Diagnosis not present

## 2023-10-04 ENCOUNTER — Ambulatory Visit (INDEPENDENT_AMBULATORY_CARE_PROVIDER_SITE_OTHER)

## 2023-10-04 DIAGNOSIS — Z23 Encounter for immunization: Secondary | ICD-10-CM | POA: Diagnosis not present

## 2023-10-04 NOTE — Progress Notes (Signed)
 Patient is in office today for a nurse visit for the Influenza Immunization. Patient Injection was given in the  Left deltoid. Patient tolerated injection well.

## 2023-10-07 ENCOUNTER — Other Ambulatory Visit (INDEPENDENT_AMBULATORY_CARE_PROVIDER_SITE_OTHER): Payer: Self-pay | Admitting: Otolaryngology

## 2023-10-09 DIAGNOSIS — N4232 Atypical small acinar proliferation of prostate: Secondary | ICD-10-CM | POA: Diagnosis not present

## 2023-10-09 DIAGNOSIS — R3121 Asymptomatic microscopic hematuria: Secondary | ICD-10-CM | POA: Diagnosis not present

## 2023-10-09 DIAGNOSIS — N401 Enlarged prostate with lower urinary tract symptoms: Secondary | ICD-10-CM | POA: Diagnosis not present

## 2023-10-09 DIAGNOSIS — R972 Elevated prostate specific antigen [PSA]: Secondary | ICD-10-CM | POA: Diagnosis not present

## 2023-10-09 DIAGNOSIS — R3912 Poor urinary stream: Secondary | ICD-10-CM | POA: Diagnosis not present

## 2023-10-09 DIAGNOSIS — R351 Nocturia: Secondary | ICD-10-CM | POA: Diagnosis not present

## 2023-10-19 ENCOUNTER — Other Ambulatory Visit: Payer: Self-pay | Admitting: Family Medicine

## 2023-10-19 DIAGNOSIS — E119 Type 2 diabetes mellitus without complications: Secondary | ICD-10-CM

## 2023-10-25 ENCOUNTER — Other Ambulatory Visit: Payer: Self-pay | Admitting: Family Medicine

## 2023-11-14 DIAGNOSIS — H401131 Primary open-angle glaucoma, bilateral, mild stage: Secondary | ICD-10-CM | POA: Diagnosis not present

## 2023-11-14 DIAGNOSIS — H2513 Age-related nuclear cataract, bilateral: Secondary | ICD-10-CM | POA: Diagnosis not present

## 2023-11-16 ENCOUNTER — Telehealth: Payer: Self-pay | Admitting: *Deleted

## 2023-11-16 DIAGNOSIS — G4733 Obstructive sleep apnea (adult) (pediatric): Secondary | ICD-10-CM

## 2023-11-16 NOTE — Telephone Encounter (Signed)
 Copied from CRM [id number]. Topic: Clinical - Medical Advice >> Nov 16, 2023  1:39 PM Thersia BROCKS wrote: Reason for CRM: Patient called in stated we was seen by eye doctor, and wants Dr.Fry to set up appointment for a sleep apena

## 2023-11-17 NOTE — Telephone Encounter (Signed)
 I did the referral

## 2023-11-17 NOTE — Telephone Encounter (Signed)
 Pt needs a  referral to Pulmonary, referral in pt chart is closed due to pt not answering the calls from their office.

## 2023-11-17 NOTE — Addendum Note (Signed)
 Addended by: JOHNNY SENIOR A on: 11/17/2023 02:17 PM   Modules accepted: Orders

## 2023-12-29 ENCOUNTER — Other Ambulatory Visit: Payer: Self-pay | Admitting: Family Medicine

## 2024-01-16 ENCOUNTER — Other Ambulatory Visit: Payer: Self-pay | Admitting: Family Medicine

## 2024-01-16 DIAGNOSIS — E119 Type 2 diabetes mellitus without complications: Secondary | ICD-10-CM

## 2024-01-26 ENCOUNTER — Other Ambulatory Visit: Payer: Self-pay | Admitting: Internal Medicine

## 2024-01-26 MED ORDER — METOPROLOL TARTRATE 25 MG PO TABS: 12.5000 mg | ORAL_TABLET | Freq: Two times a day (BID) | ORAL | 3 refills | Status: AC

## 2024-02-03 ENCOUNTER — Other Ambulatory Visit: Payer: Self-pay | Admitting: Family Medicine

## 2024-02-08 ENCOUNTER — Encounter (HOSPITAL_BASED_OUTPATIENT_CLINIC_OR_DEPARTMENT_OTHER): Payer: Self-pay | Admitting: Pulmonary Disease

## 2024-02-08 ENCOUNTER — Ambulatory Visit (HOSPITAL_BASED_OUTPATIENT_CLINIC_OR_DEPARTMENT_OTHER): Admitting: Pulmonary Disease

## 2024-02-08 VITALS — BP 119/67 | HR 85 | Ht 76.0 in | Wt 277.0 lb

## 2024-02-08 DIAGNOSIS — G4733 Obstructive sleep apnea (adult) (pediatric): Secondary | ICD-10-CM

## 2024-02-08 NOTE — Progress Notes (Signed)
 Epworth Sleepiness Scale  Use the following scale to choose the most appropriate number for each situation. 0 Would never nod off 1  Slight  chance of nodding off 2 Moderate chance of nodding off 3 High chance of nodding off  Sitting and reading: 1 Watching TV: 3 Sitting, inactive, in a public place (e.g., in a meeting, theater, or dinner event): 0 As a passenger in a car for an hour or more without stopping for a break: 0 Lying down to rest when circumstances permit:0 Sitting and talking to someone: 0 Sitting quietly after a meal without alcohol: 0 In a car, while stopped for a few  minutes in traffic or at a light: 0  TOTOAL: 4

## 2024-02-08 NOTE — Progress Notes (Signed)
 "  New Patient Pulmonology Office Visit   Subjective:  Patient ID: Curtis Duran, male    DOB: [birth year]  MRN: [id number]  Referred by: Johnny Garnette LABOR, MD  CC:  Chief Complaint  Patient presents with   Sleep Apnea    Follow up     Discussed the use of AI scribe software for clinical note transcription with the patient, who gave verbal consent to proceed.  History of Present Illness Curtis Duran is a 69 year old male who presents with sleep problems. He was referred by Dr. Johnny for evaluation of sleep problems.  He sleeps about 2 hours at night, waking around 2:00-2:30 AM, then watches TV for about an hour before falling back asleep until 7:00-8:00 AM. He often wakes having slid down in bed despite using three pillows and sleeping on his back. His wife has not noticed snoring or breathing interruptions. He denies morning headaches or dry mouth. He feels rested on waking but takes 15-30 minute naps during the day.  He recalls a prior sleep test years ago that showed about 16 breathing interruptions. He was advised to use CPAP but stopped because it was uncomfortable.  He has irregular heartbeat diagnosed 5-10 years ago, present since youth, and takes metoprolol  twice daily with good control.  He has diabetes and associates it with daytime tiredness and need for naps. He has staining on his feet related to diabetes but denies leg swelling.   ESS 4  Significant tests/ events reviewed  06/2017 HST AHI 15/h, low sat 74%  ROS  Constitutional: negative for anorexia, fevers and sweats  Eyes: negative for irritation, redness and visual disturbance  Ears, nose, mouth, throat, and face: negative for earaches, epistaxis, nasal congestion and sore throat  Respiratory: negative for cough, dyspnea on exertion, sputum and wheezing  Cardiovascular: negative for chest pain, dyspnea, lower extremity edema, orthopnea, palpitations and syncope  Gastrointestinal: negative for abdominal pain,  constipation, diarrhea, melena, nausea and vomiting  Genitourinary:negative for dysuria, frequency and hematuria  Hematologic/lymphatic: negative for bleeding, easy bruising and lymphadenopathy  Musculoskeletal:negative for arthralgias, muscle weakness and stiff joints  Neurological: negative for coordination problems, gait problems, headaches and weakness  Endocrine: negative for diabetic symptoms including polydipsia, polyuria and weight loss   Allergies: Citalopram hydrobromide, Crestor  [rosuvastatin ], and Other Current Medications[1] Past Medical History:  Diagnosis Date   Anxiety    Bronchial pneumonia    back in January   Carpal tunnel syndrome, bilateral    Coronary artery calcification 01/25/2023   Cath 08/2021: min nonobstructive coronary plaque  Severe R subclavian tortuosity - Need R fem artery approach if cath needed in futureFHx of CAD  One Bro w MI x 2; Another Bro ? w MI    Depression    Diabetes mellitus without complication (HCC)    Eczema    FRONT OF CHEST, MIDDLE OF BACK, FRONT OF LEGS   GERD (gastroesophageal reflux disease)    Glaucoma    sees Dr. Lorella at [facility].   Heart murmur    as child    Hyperlipidemia    Hypertension    Lightheadedness    Low back pain    Sleep apnea    wears cpap    Past Surgical History:  Procedure Laterality Date   CERVICAL SPINE SURGERY  2014   per Dr. Rockey [place]   COLONOSCOPY  05/05/2021   per Dr. Legrand, adenomatous polyp,repeat in 5 yrs   LEFT HEART CATH AND CORONARY ANGIOGRAPHY  N/A 10/14/2021   Procedure: LEFT HEART CATH AND CORONARY ANGIOGRAPHY;  Surgeon: Dann Candyce RAMAN, MD;  Location: [facility] INVASIVE CV LAB;  Service: Cardiovascular;  Laterality: N/A;   LIPOMA EXCISION N/A 12/01/2015   Procedure: EXCISION OF LIPOMA OF BACK;  Surgeon: Debby Shipper, MD;  Location: [facility] OR;  Service: General;  Laterality: N/A;   POLYPECTOMY     PROSTATE BIOPSY  05/2023   3 polyps removed   Family History  Problem  Relation Age of Onset   Cancer Father    Cancer Sister    Colon cancer Brother    Diabetes Brother    Diabetes Other    Colon polyps Neg Hx    Esophageal cancer Neg Hx    Rectal cancer Neg Hx    Stomach cancer Neg Hx    Social History   Socioeconomic History   Marital status: Married    Spouse name: Not on file   Number of children: Not on file   Years of education: Not on file   Highest education level: Not on file  Occupational History   Not on file  Tobacco Use   Smoking status: Never   Smokeless tobacco: Never  Vaping Use   Vaping status: Never Used  Substance and Sexual Activity   Alcohol use: No    Alcohol/week: 0.0 standard drinks of alcohol   Drug use: No   Sexual activity: Yes  Other Topics Concern   Not on file  Social History Narrative   Right handed   Lives with wife in one story home   Social Drivers of Health   Tobacco Use: Low Risk (02/08/2024)   Patient History    Smoking Tobacco Use: Never    Smokeless Tobacco Use: Never    Passive Exposure: Not on file  Financial Resource Strain: Not on file  Food Insecurity: Not on file  Transportation Needs: Not on file  Physical Activity: Not on file  Stress: Not on file  Social Connections: Not on file  Intimate Partner Violence: Not on file  Depression (PHQ2-9): Medium Risk (07/22/2022)   Depression (PHQ2-9)    PHQ-2 Score: 5  Alcohol Screen: Not on file  Housing: Not on file  Utilities: Not on file  Health Literacy: Not on file       Objective:  BP 119/67   Pulse 85   Ht 6' 4 (1.93 m)   Wt 277 lb (125.6 kg)   SpO2 96%   BMI 33.72 kg/m    Physical Exam  Gen. Pleasant, obese, in no distress ENT - class 2 airway, no post nasal drip Neck: No JVD, no thyromegaly, no carotid bruits Lungs: no use of accessory muscles, no dullness to percussion, decreased without rales or rhonchi  Cardiovascular: Rhythm regular, heart sounds  normal, no murmurs or gallops, no peripheral edema Musculoskeletal:  No deformities, no cyanosis or clubbing , no tremors       Assessment & Plan:  Assessment and Plan Assessment & Plan Obstructive sleep apnea Chronic obstructive sleep apnea with previous CPAP intolerance due to mask discomfort. Reports sleeping only two hours at night, waking up, and feeling rested. No snoring or apneic episodes reported by spouse. Previous home sleep test indicated 16 apneic episodes, but he disputes accuracy due to equipment malfunction. Current symptoms suggest possible worsening of OSA. - Ordered home sleep test to evaluate current severity of OSA. - If home sleep test indicates more than 15 apneic episodes, will consider reinitiating CPAP therapy with a different  mask design to improve comfort. Nasal pillows has worked in the past       No follow-ups on file.   Harden ROCKFORD Jude, MD    [1]  Current Outpatient Medications:    aspirin  EC 81 MG tablet, Take 81 mg by mouth daily. Swallow whole., Disp: , Rfl:    atorvastatin  (LIPITOR) 10 MG tablet, TAKE 1 TABLET BY MOUTH EVERY DAY, Disp: 90 tablet, Rfl: 3   azelastine  (ASTELIN ) 0.1 % nasal spray, Place 2 sprays into both nostrils 2 (two) times daily. Use in each nostril as directed, Disp: 30 mL, Rfl: 12   buPROPion  (WELLBUTRIN  XL) 300 MG 24 hr tablet, Take 1 tablet (300 mg total) by mouth daily., Disp: 90 tablet, Rfl: 3   celecoxib  (CELEBREX ) 200 MG capsule, TAKE 1 CAPSULE BY MOUTH TWICE A DAY, Disp: 180 capsule, Rfl: 3   cholecalciferol (VITAMIN D3) 25 MCG (1000 UNIT) tablet, Take 1,000 Units by mouth daily., Disp: , Rfl:    cyclobenzaprine  (FLEXERIL ) 10 MG tablet, Take 1 tablet (10 mg total) by mouth 3 (three) times daily as needed for muscle spasms., Disp: 90 tablet, Rfl: 5   dorzolamide -timolol  (COSOPT ) 22.3-6.8 MG/ML ophthalmic solution, Place 1 drop into both eyes daily., Disp: 10 mL, Rfl: 0   DULoxetine  (CYMBALTA ) 30 MG capsule, TAKE 1 CAPSULE BY MOUTH EVERY DAY, Disp: 90 capsule, Rfl: 3   gabapentin  (NEURONTIN )  100 MG capsule, TAKE 1 CAPSULE BY MOUTH TWICE A DAY, Disp: 60 capsule, Rfl: 3   latanoprost (XALATAN) 0.005 % ophthalmic solution, Place 1 drop into both eyes at bedtime., Disp: , Rfl:    levocetirizine (XYZAL  ALLERGY 24HR) 5 MG tablet, Take 1 tablet (5 mg total) by mouth every evening., Disp: 30 tablet, Rfl: 3   losartan -hydrochlorothiazide (HYZAAR) 50-12.5 MG tablet, TAKE 1 TABLET BY MOUTH EVERY DAY, Disp: 90 tablet, Rfl: 3   metFORMIN  (GLUCOPHAGE ) 500 MG tablet, TAKE 1 TABLET BY MOUTH 2 TIMES DAILY WITH A MEAL., Disp: 180 tablet, Rfl: 0   metoprolol  tartrate (LOPRESSOR ) 25 MG tablet, Take 0.5 tablets (12.5 mg total) by mouth 2 (two) times daily., Disp: 90 tablet, Rfl: 3   nitroGLYCERIN  (NITROSTAT ) 0.4 MG SL tablet, Place 1 tablet (0.4 mg total) under the tongue every 5 (five) minutes as needed for chest pain., Disp: 25 tablet, Rfl: 6   omeprazole  (PRILOSEC) 40 MG capsule, TAKE 1 CAPSULE BY MOUTH TWICE A DAY, Disp: 180 capsule, Rfl: 0   RHOPRESSA 0.02 % SOLN, Place 1 drop into both eyes every morning., Disp: , Rfl:   "

## 2024-02-08 NOTE — Patient Instructions (Addendum)
 X Home sleep test Based on this, we will decide about CPAP machine    VISIT SUMMARY: Curtis Duran, a 69 year old male, was evaluated for sleep problems. He reported sleeping only two hours at night, waking up, and then falling back asleep after watching TV. He feels rested upon waking but takes naps during the day. He has a history of obstructive sleep apnea and previously discontinued CPAP therapy due to discomfort. He also has an irregular heartbeat and diabetes.  YOUR PLAN: -OBSTRUCTIVE SLEEP APNEA: Obstructive sleep apnea is a condition where the airway becomes blocked during sleep, causing breathing interruptions. You reported sleeping only two hours at night and feeling rested upon waking. We have ordered a home sleep test to evaluate the current severity of your sleep apnea. If the test indicates more than 15 apneic episodes, we will consider reinitiating CPAP therapy with a different mask design to improve comfort.  INSTRUCTIONS: Please complete the home sleep test as ordered. Follow up with us  to discuss the results and next steps.    Contains text generated by [employer].
